# Patient Record
Sex: Female | Born: 1955 | Race: Black or African American | Hispanic: No | Smoking: Never smoker
Health system: Southern US, Community
[De-identification: ages and names within clinical notes are randomized; demographics above are authoritative.]

## PROBLEM LIST (undated history)

## (undated) HISTORY — PX: PARTIAL HYSTERECTOMY: SHX80

---

## 1999-10-26 ENCOUNTER — Other Ambulatory Visit: Admission: RE | Admit: 1999-10-26 | Discharge: 1999-10-26 | Payer: Self-pay | Admitting: Obstetrics

## 2000-01-15 ENCOUNTER — Ambulatory Visit (HOSPITAL_COMMUNITY): Admission: RE | Admit: 2000-01-15 | Discharge: 2000-01-15 | Payer: Self-pay | Admitting: Obstetrics

## 2000-01-15 ENCOUNTER — Encounter: Payer: Self-pay | Admitting: Obstetrics

## 2000-08-26 ENCOUNTER — Emergency Department (HOSPITAL_COMMUNITY): Admission: EM | Admit: 2000-08-26 | Discharge: 2000-08-26 | Payer: Self-pay | Admitting: Emergency Medicine

## 2000-08-26 ENCOUNTER — Encounter: Payer: Self-pay | Admitting: *Deleted

## 2000-09-29 ENCOUNTER — Emergency Department (HOSPITAL_COMMUNITY): Admission: EM | Admit: 2000-09-29 | Discharge: 2000-09-29 | Payer: Self-pay | Admitting: Emergency Medicine

## 2001-02-04 ENCOUNTER — Other Ambulatory Visit: Admission: RE | Admit: 2001-02-04 | Discharge: 2001-02-04 | Payer: Self-pay | Admitting: Obstetrics

## 2001-02-18 ENCOUNTER — Ambulatory Visit (HOSPITAL_COMMUNITY): Admission: RE | Admit: 2001-02-18 | Discharge: 2001-02-18 | Payer: Self-pay

## 2001-02-18 ENCOUNTER — Encounter: Payer: Self-pay | Admitting: Obstetrics

## 2002-09-21 ENCOUNTER — Inpatient Hospital Stay (HOSPITAL_COMMUNITY): Admission: AD | Admit: 2002-09-21 | Discharge: 2002-09-21 | Payer: Self-pay | Admitting: *Deleted

## 2002-10-01 ENCOUNTER — Encounter: Admission: RE | Admit: 2002-10-01 | Discharge: 2002-10-01 | Payer: Self-pay | Admitting: Obstetrics and Gynecology

## 2002-10-08 ENCOUNTER — Ambulatory Visit (HOSPITAL_COMMUNITY): Admission: RE | Admit: 2002-10-08 | Discharge: 2002-10-08 | Payer: Self-pay

## 2002-11-05 ENCOUNTER — Encounter: Admission: RE | Admit: 2002-11-05 | Discharge: 2002-11-05 | Payer: Self-pay | Admitting: Obstetrics and Gynecology

## 2002-11-26 ENCOUNTER — Encounter: Admission: RE | Admit: 2002-11-26 | Discharge: 2002-11-26 | Payer: Self-pay | Admitting: Obstetrics and Gynecology

## 2002-11-30 ENCOUNTER — Inpatient Hospital Stay (HOSPITAL_COMMUNITY): Admission: RE | Admit: 2002-11-30 | Discharge: 2002-12-03 | Payer: Self-pay | Admitting: Family Medicine

## 2002-11-30 ENCOUNTER — Encounter (INDEPENDENT_AMBULATORY_CARE_PROVIDER_SITE_OTHER): Payer: Self-pay | Admitting: Specialist

## 2002-12-10 ENCOUNTER — Encounter: Admission: RE | Admit: 2002-12-10 | Discharge: 2002-12-10 | Payer: Self-pay | Admitting: Obstetrics and Gynecology

## 2004-02-28 ENCOUNTER — Ambulatory Visit (HOSPITAL_COMMUNITY): Admission: RE | Admit: 2004-02-28 | Discharge: 2004-02-28 | Payer: Self-pay | Admitting: Family Medicine

## 2004-03-13 ENCOUNTER — Encounter: Admission: RE | Admit: 2004-03-13 | Discharge: 2004-03-13 | Payer: Self-pay | Admitting: Family Medicine

## 2005-06-14 ENCOUNTER — Encounter: Admission: RE | Admit: 2005-06-14 | Discharge: 2005-06-14 | Payer: Self-pay | Admitting: Internal Medicine

## 2005-07-03 ENCOUNTER — Encounter: Admission: RE | Admit: 2005-07-03 | Discharge: 2005-07-03 | Payer: Self-pay | Admitting: Internal Medicine

## 2007-01-10 ENCOUNTER — Emergency Department (HOSPITAL_COMMUNITY): Admission: EM | Admit: 2007-01-10 | Discharge: 2007-01-10 | Payer: Self-pay | Admitting: Emergency Medicine

## 2007-12-15 ENCOUNTER — Encounter: Admission: RE | Admit: 2007-12-15 | Discharge: 2007-12-15 | Payer: Self-pay | Admitting: Family Medicine

## 2007-12-19 ENCOUNTER — Emergency Department (HOSPITAL_COMMUNITY): Admission: EM | Admit: 2007-12-19 | Discharge: 2007-12-19 | Payer: Self-pay | Admitting: Emergency Medicine

## 2009-02-09 IMAGING — MG MM DIGITAL SCREENING BILAT W/ CAD
4 series · 4 of 4 positions shown · non-contrast
Comparison: Prior studies.

DG SCREEN MAMMOGRAM BILATERAL
Bilateral CC and MLO view(s) were taken.
Prior study comparison: June 14, 2005, bilateral diagnostic mammogram.

DIGITAL SCREENING MAMMOGRAM WITH CAD:

[R CC]
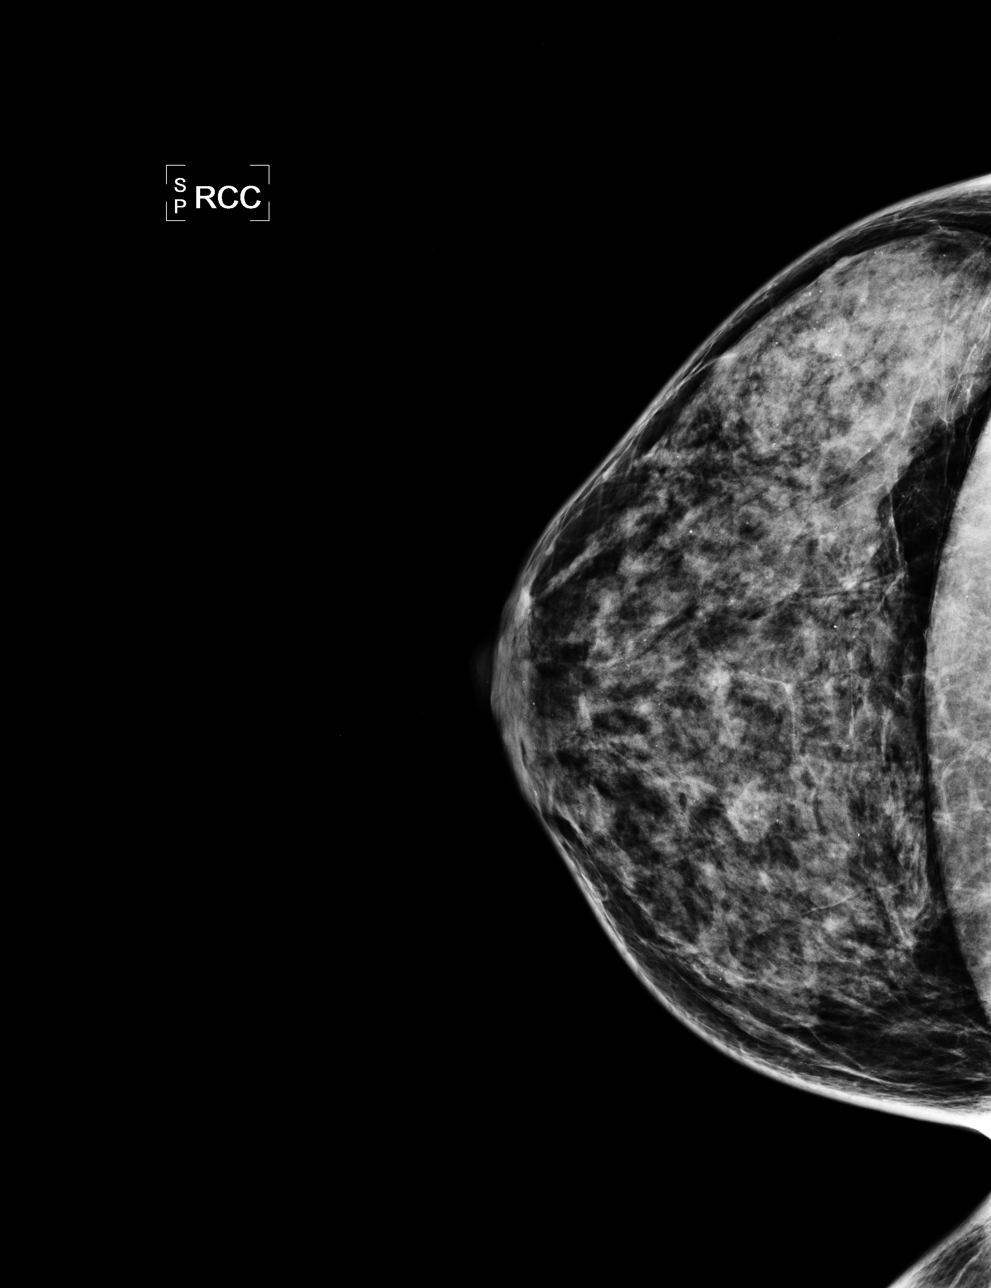

[L CC]
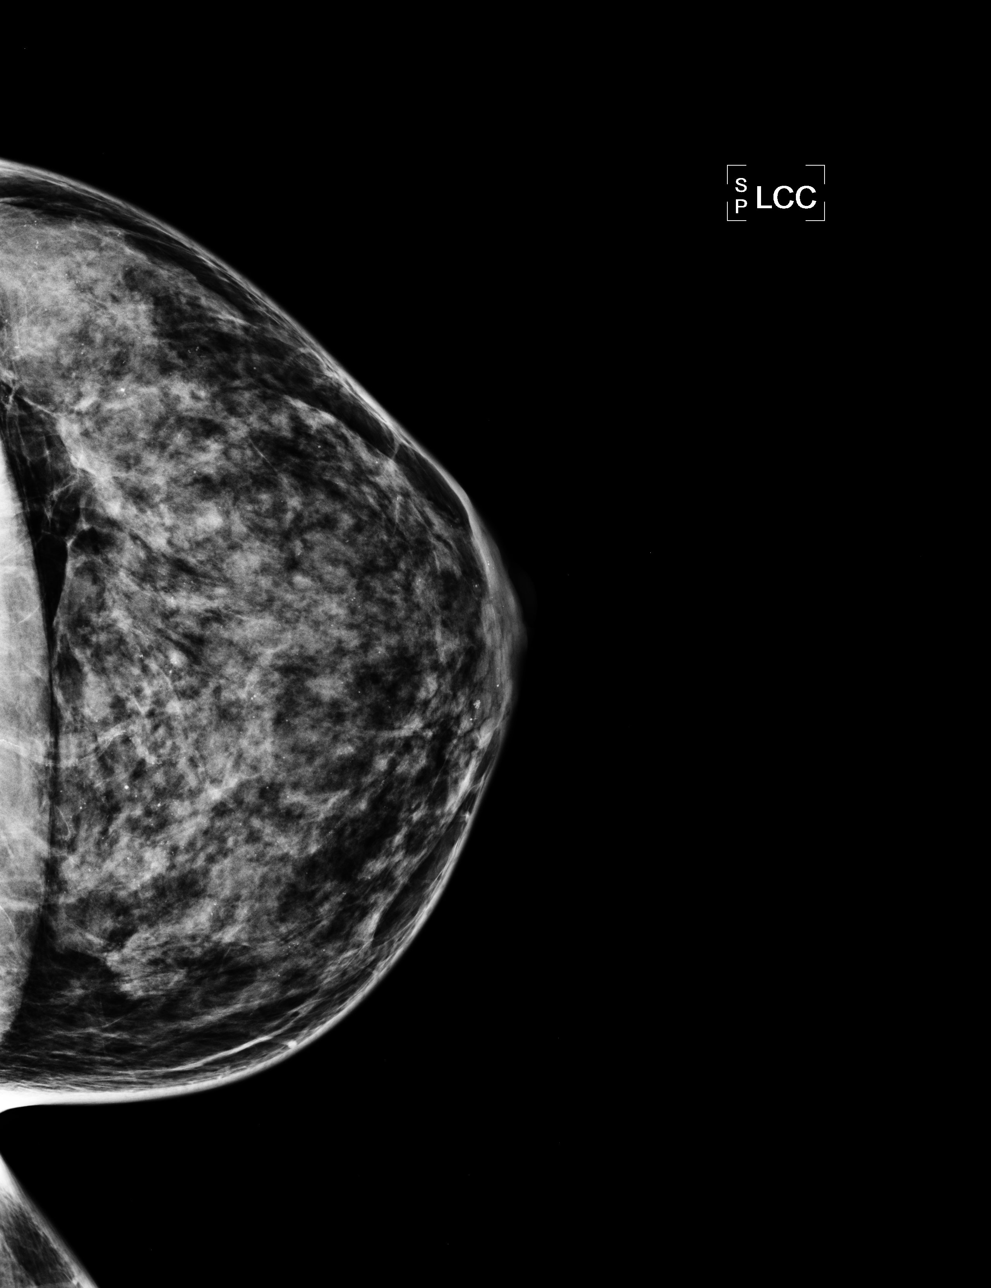

[L MLO]
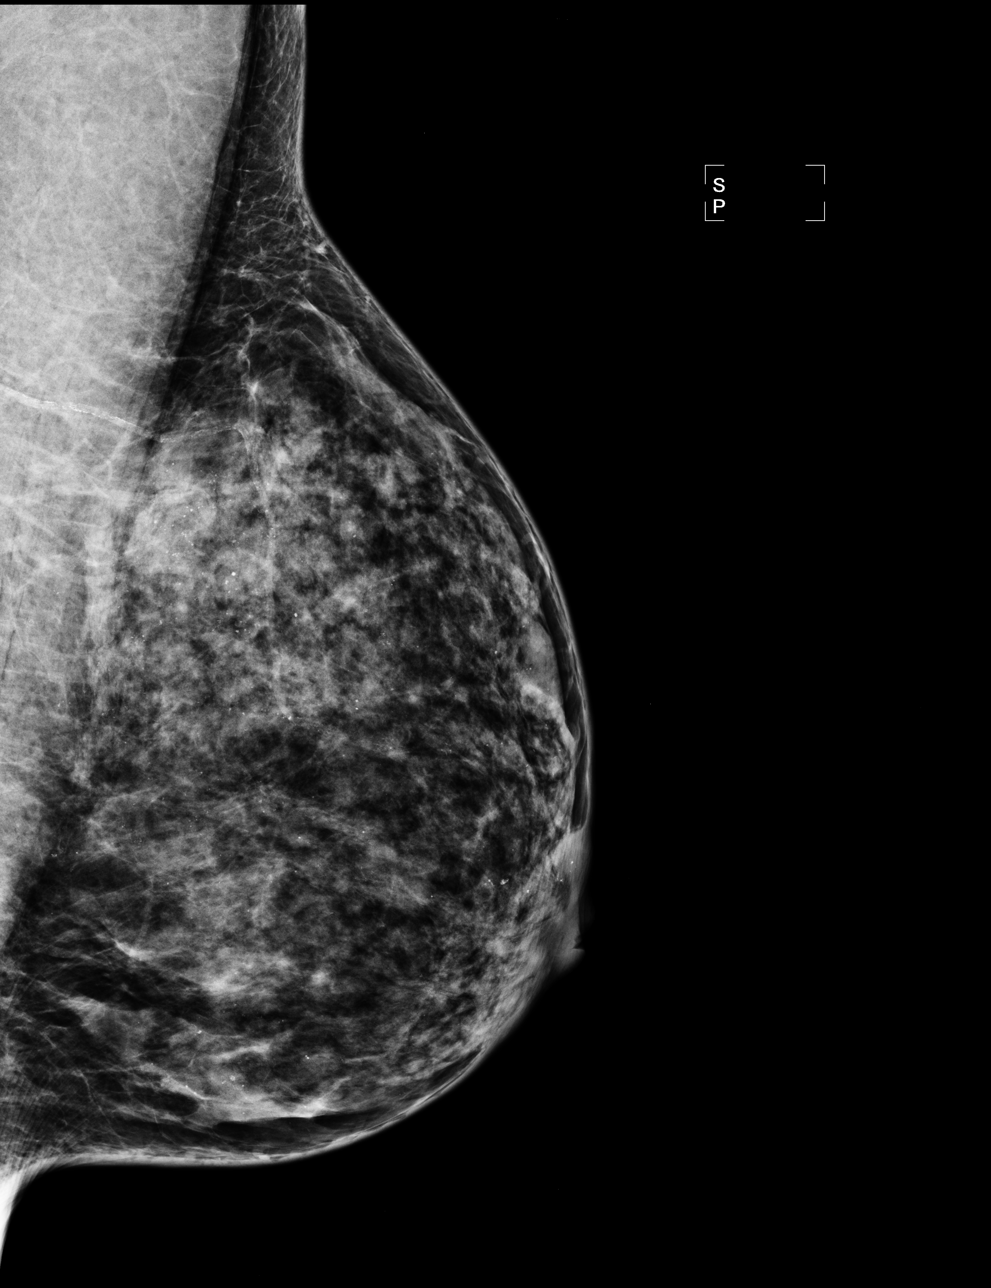

[R MLO]
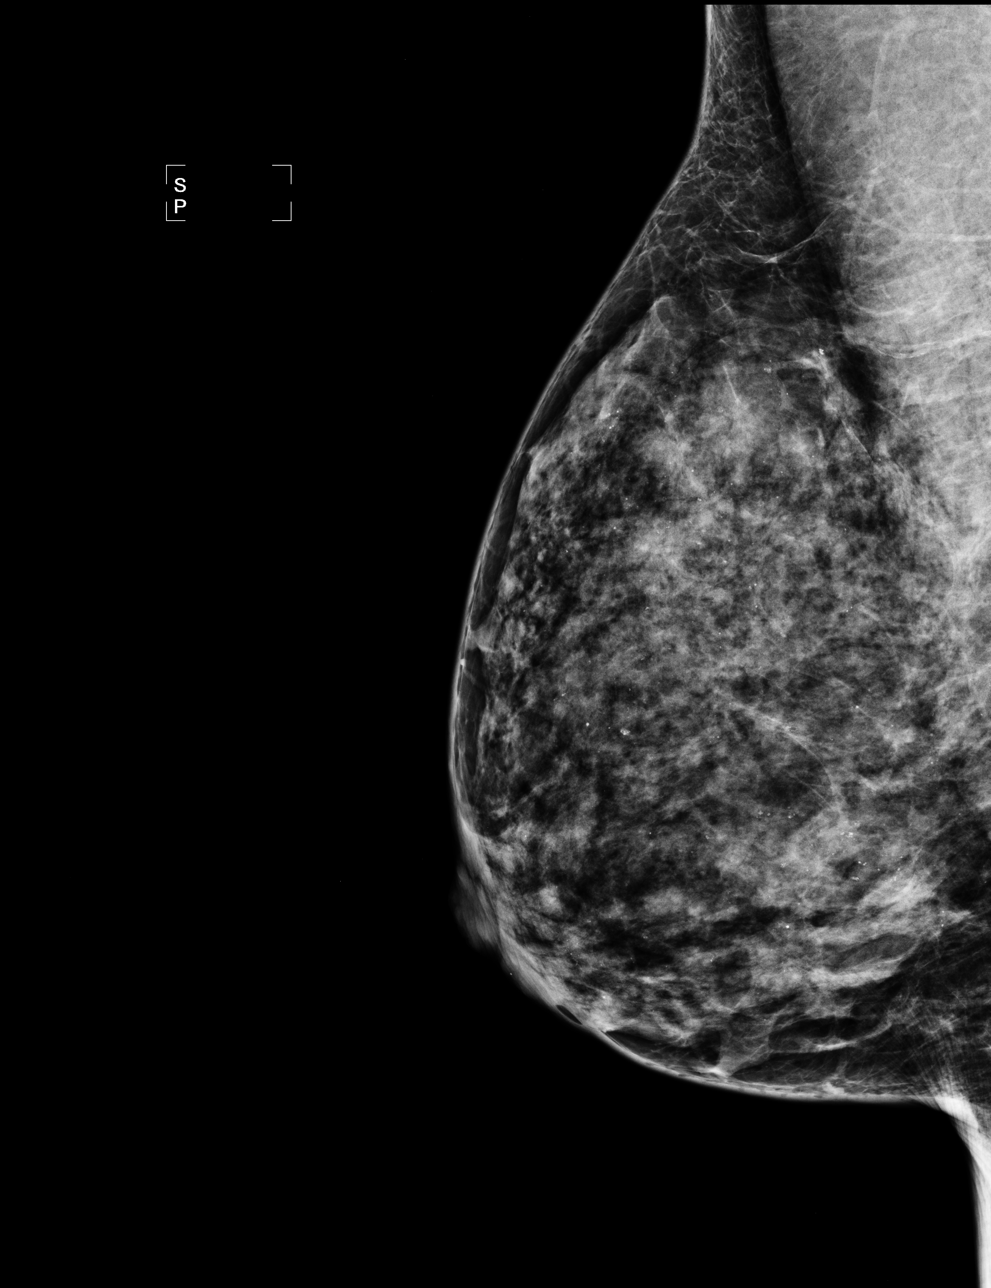

[4 of 4 positions shown; findings below may reference images not displayed]

The breast tissue is extremely dense.  There is no dominant mass, architectural distortion or 
calcification to suggest malignancy.
IMPRESSION: No mammographic evidence of malignancy.  Suggest yearly screening mammography.

ASSESSMENT: Negative - BI-RADS 1

Screening mammogram in 1 year.
ANALYZED BY COMPUTER AIDED DETECTION. , THIS PROCEDURE WAS A DIGITAL MAMMOGRAM.

## 2009-03-09 ENCOUNTER — Ambulatory Visit: Payer: Self-pay | Admitting: Sports Medicine

## 2009-03-09 DIAGNOSIS — M7750 Other enthesopathy of unspecified foot: Secondary | ICD-10-CM | POA: Insufficient documentation

## 2009-03-09 DIAGNOSIS — M79609 Pain in unspecified limb: Secondary | ICD-10-CM

## 2009-03-29 ENCOUNTER — Ambulatory Visit: Payer: Self-pay | Admitting: Sports Medicine

## 2009-03-29 DIAGNOSIS — M84376A Stress fracture, unspecified foot, initial encounter for fracture: Secondary | ICD-10-CM | POA: Insufficient documentation

## 2009-04-06 ENCOUNTER — Encounter: Admission: RE | Admit: 2009-04-06 | Discharge: 2009-04-06 | Payer: Self-pay | Admitting: Family Medicine

## 2009-04-11 ENCOUNTER — Emergency Department (HOSPITAL_COMMUNITY): Admission: EM | Admit: 2009-04-11 | Discharge: 2009-04-11 | Payer: Self-pay | Admitting: *Deleted

## 2009-04-19 ENCOUNTER — Ambulatory Visit: Payer: Self-pay | Admitting: Sports Medicine

## 2009-05-17 ENCOUNTER — Ambulatory Visit: Payer: Self-pay | Admitting: Sports Medicine

## 2009-07-17 ENCOUNTER — Emergency Department (HOSPITAL_COMMUNITY): Admission: EM | Admit: 2009-07-17 | Discharge: 2009-07-17 | Payer: Self-pay | Admitting: Emergency Medicine

## 2009-09-04 ENCOUNTER — Emergency Department (HOSPITAL_COMMUNITY): Admission: EM | Admit: 2009-09-04 | Discharge: 2009-09-04 | Payer: Self-pay | Admitting: Nurse Practitioner

## 2009-09-05 ENCOUNTER — Ambulatory Visit: Payer: Self-pay | Admitting: Family Medicine

## 2009-09-05 DIAGNOSIS — S92919A Unspecified fracture of unspecified toe(s), initial encounter for closed fracture: Secondary | ICD-10-CM | POA: Insufficient documentation

## 2010-06-17 ENCOUNTER — Emergency Department (HOSPITAL_COMMUNITY): Admission: EM | Admit: 2010-06-17 | Discharge: 2010-06-18 | Payer: Self-pay | Admitting: Emergency Medicine

## 2011-04-01 LAB — URINALYSIS, ROUTINE W REFLEX MICROSCOPIC
Bilirubin Urine: NEGATIVE
Nitrite: NEGATIVE
Protein, ur: NEGATIVE mg/dL
Specific Gravity, Urine: 1.004 — ABNORMAL LOW (ref 1.005–1.030)
Urobilinogen, UA: 0.2 mg/dL (ref 0.0–1.0)

## 2011-04-01 LAB — DIFFERENTIAL
Basophils Absolute: 0 10*3/uL (ref 0.0–0.1)
Basophils Relative: 1 % (ref 0–1)
Eosinophils Absolute: 0.1 10*3/uL (ref 0.0–0.7)
Eosinophils Relative: 3 % (ref 0–5)
Monocytes Absolute: 0.3 10*3/uL (ref 0.1–1.0)
Monocytes Relative: 6 % (ref 3–12)
Neutro Abs: 2.1 10*3/uL (ref 1.7–7.7)

## 2011-04-01 LAB — CBC
HCT: 38.8 % (ref 36.0–46.0)
Hemoglobin: 13.2 g/dL (ref 12.0–15.0)
MCHC: 34.1 g/dL (ref 30.0–36.0)
MCV: 93.3 fL (ref 78.0–100.0)
RBC: 4.16 MIL/uL (ref 3.87–5.11)
RDW: 13.2 % (ref 11.5–15.5)

## 2011-04-01 LAB — BASIC METABOLIC PANEL
BUN: 5 mg/dL — ABNORMAL LOW (ref 6–23)
Calcium: 9.7 mg/dL (ref 8.4–10.5)
Creatinine, Ser: 0.62 mg/dL (ref 0.4–1.2)
GFR calc non Af Amer: >60 mL/min (ref >60)
Glucose, Bld: 93 mg/dL (ref 70–99)

## 2011-04-01 LAB — URINE MICROSCOPIC-ADD ON

## 2011-05-11 NOTE — Op Note (Signed)
NAME:  Alejandra Scott, Alejandra Scott                        ACCOUNT NO.:  [id number]   MEDICAL RECORD NO.:  [id number]                   PATIENT TYPE:  INP   LOCATION:  9322                                 FACILITY:  [facility]   PHYSICIAN:  Tanya S. Shawnie Pons, M.D.                DATE OF BIRTH:  May 06, 1956   DATE OF PROCEDURE:  11/30/2002  DATE OF DISCHARGE:                                 OPERATIVE REPORT   PREOPERATIVE DIAGNOSES:  1. Fibroid uterus.  2. Pelvic pain.  3. Menometrorrhagia.  4. History of anemia.  5. History of recurrent yeast infections.   POSTOPERATIVE DIAGNOSES:  1. Fibroid uterus.  2. Pelvic pain.  3. Menometrorrhagia.  4. History of anemia.  5. History of recurrent yeast infections.   PROCEDURE:  Total abdominal hysterectomy.   SURGEON:  Shelbie Proctor. Shawnie Pons, M.D.   ASSISTANT:  Mary Sella. Orlene Erm, M.D.   ANESTHESIA:  General.   FINDINGS:  Large fibroid uterus, normal appearing ovaries and tubes.   ESTIMATED BLOOD LOSS:  Approximately 250 cc.   SPECIMENS:  Uterus and cervix to pathology.   INDICATIONS:  The patient is a 55 year old gravida 4, para 3 with an  approximate two year history of known large fibroids that cause her  excessive pain and heavy menses.  She does have a history of anemia.  She  decided for definitive therapy and is admitted for hysterectomy.   DESCRIPTION OF PROCEDURE:  The patient is taken to the OR where general  anesthesia is administered.  She was placed in the supine position where  anesthesia was found to be adequate.  She was prepped and draped in the  usual sterile fashion.  A knife was used to make a Pfannenstiel incision in  the skin and this incision was carried down to the underlying fascia using  the Bovie cautery.  The fascia was then entered sharply and dissected  laterally also with the Bovie cautery.  Two Kocher's were used to dissect  the fascia off the underlying rectus bluntly laterally and with the Bovie  cautery in the midline.  This  was repeated in the inferior edge of the  fascia as well.  The rectus was subsequently divided in its midline and  taken down in the midline with the Bovie cautery.  Peritoneum was then  grasped and entered sharply with the Mayo scissors.  The peritoneum incision  was extended laterally with the Bovie cautery as well as inferiorly and  superiorly.  The Balfour retractor was then placed inside the abdominal  cavity.  The Balfour had to be replaced several times as the incisions were  extended further to allow for enough room.  Initially, her uterus could not  be lifted up and removed.  Once the [facility] was then placed the bowel  adequately packed, the uterus picked up.  There was noted to be an evulsed  fibroid and a figure-of-eight was used  to close this incision that was  bleeding.  The round was then suture ligated and cut with a Bovie cautery on  the patient's left.  Similarly, the right was done and the anterior leaf of  the broad ligament was then taken down.  This was carried across to initiate  a bladder flap.  The posterior leaf of the broad ligament was also taken  down and dissection done between the two leaves of the broad to locate the  ureters on both sides.  After this the utero-ovarian pedicle was doubly  clamped and ligated with first a free tie and then a suture.  The uterine  arteries were cleaned off and curved Heaney clamps were used to ligate the  uterine arteries.  Once these were ligated the straight Kocher clamps were  used sequentially to take bites down the cervix which were then similarly  ligated with a suture until the uterosacrals were taken.  They were also  suture ligated with Heaney stitch and held.  Two curved clamps were then  placed across the cervix over the vagina and cut.  The vagina was grasped  with Kocher clamps and then after an angle stitch was placed the vaginal  cuff was closed with figure-of-eight suture.  The abdomen was copiously  irrigated  with warm irrigation and it was found to be hemostatic.  The left  tube and ovary were down inside the cul-de-sac and these were moved up along  the abdominal side wall.  All packs were removed from the abdominal cavity  and the Balfour retractor also removed.  The fascia was then closed with a 0  Vicryl suture in a running fashion, the subcutaneous tissue irrigated, and  the skin closed with staples.  All instrument, needle, lap counts were  correct x2.  At the end of the case a small abrasion was noted on the  patient's abdomen believed to be consistent with possibly skin being trapped  inside the Kreamer.  Neosporin was placed over this and after the abdomen  had been cleaned of Betadine and a sterile bandage placed over this part, a  sterile bandage was also placed over the incision.  The patient was awakened  and taken to recovery room in stable condition.                                               Shelbie Proctor. Shawnie Pons, M.D.    TSP/MEDQ  D:  11/30/2002  T:  11/30/2002  Job:  [id number]

## 2011-05-11 NOTE — Discharge Summary (Signed)
NAME:  Alejandra Scott, Alejandra Scott                        ACCOUNT NO.:  [id number]   MEDICAL RECORD NO.:  [id number]                   PATIENT TYPE:  INP   LOCATION:  9322                                 [facility]ACILITY:  [facility]   PHYSICIAN:  Tanya S. Shawnie Pons, M.D.                DATE OF BIRTH:  22-Sep-1956   DATE OF ADMISSION:  11/30/2002  DATE OF DISCHARGE:  12/03/2002                                 DISCHARGE SUMMARY   DISCHARGE DIAGNOSES:  Fibroid uterus.   ADDITIONAL DIAGNOSES:  1. Significant pelvic pain.  2. Menometrorrhagia.  3. History of anemia.  4. Recurrent yeast vaginitis.   PROCEDURE:  Total abdominal hysterectomy.   LABORATORY VALUES:  Preoperative hemoglobin 14.4, postoperative hemoglobin  11.4.  Blood type B-.  Electrolytes are within normal limits on  postoperative day number one.  She had a negative urine pregnancy test.   REASON FOR ADMISSION:  Briefly, the patient is a 55 year old gravida 4, para  3 with a 10-year history of known fibroid uterus that has caused increasing  pelvic pain and menometrorrhagia who desires definitive treatment.   HOSPITAL COURSE:  The patient was admitted on the day of surgery and  underwent a total abdominal hysterectomy.  Postoperatively she was  transferred to the floor and was placed on a PCA morphine pump.  She had  adequate analgesia and on postoperative day number one was able to ambulate.  Her Foley was discontinued.  She was able to void without difficulty.  She  began passing flatus on this day and was advanced to a regular diet on  postoperative day number two.  There was some difficulty in pain control  after her PCA was discontinued.  This was worked out prior to discharge.  The patient remained afebrile, was tolerating a regular diet, and pain was  adequately controlled at the time of discharge.  Prior to discharge her  staples were removed.  On postoperative day number three patient was doing  so well it was felt she was stable for  discharge.   DISCHARGE DISPOSITION:  Condition:  The patient discharged home in good  condition.  Follow-up will be in two weeks at the [facility].   DISCHARGE MEDICATIONS:  1. Vicodin one to two p.o. q.4-6h. p.r.n. severe pain.  2. Naprosyn 500 mg one p.o. b.i.d. for pain.   DISCHARGE INSTRUCTIONS:  No heavy lifting for the next six weeks.  No  intercourse for the next two weeks.  No driving for the next one week.  Follow-up will be in [facility] Clinic in two weeks.                                               Shelbie Proctor. Shawnie Pons, M.D.    TSP/MEDQ  D:  12/03/2002  T:  12/03/2002  Job:  [id number]

## 2011-05-11 NOTE — H&P (Signed)
NAME:  Alejandra Scott, Alejandra Scott                        ACCOUNT NO.:  [id number]   MEDICAL RECORD NO.:  [id number]                   PATIENT TYPE:  INP   LOCATION:  NA                                   [facility]   PHYSICIAN:  Tanya S. Shawnie Pons, M.D.                DATE OF BIRTH:  1956-09-01   DATE OF ADMISSION:  11/30/2002  DATE OF DISCHARGE:                                HISTORY & PHYSICAL   REASON FOR ADMISSION:  Total abdominal hysterectomy.   HISTORY OF PRESENT ILLNESS:  The patient is a 55 year old gravida 4, para 3  with approximately a two year history of known fibroids that has caused  pelvic pain.  The patient reports increasing abdominal girth and pain over  the last two years with heavy vaginal bleeding.  Her menses have also become  increasing in duration.  The patient also reports some incontinence with  coughing and laughing.   PAST MEDICAL HISTORY:  Significant for anemia.   PAST SURGICAL HISTORY:  She has had a prior BTL.   ALLERGIES:  She has no known allergies.   MEDICATIONS:  Advil p.r.n., numerous vitamins and herbal preparations for  various things.   SOCIAL HISTORY:  She drinks one to two glasses of alcohol a week.  She uses  no tobacco, no drugs.  She works as a Associate Professor.   FAMILY HISTORY:  Significant for pancreatic cancer in mother.  Numerous  ovarian and stomach cancers in family members.  She has no significant  history of breast cancer.  Has sister with diabetes and a brother with  coronary artery disease.   PAST OB HISTORY:  Significant for spontaneous vaginal delivery x3 with the  largest child being 6 pounds 2 ounces, SAB x1.   PAST GYN HISTORY:  She had menarche at age 89 and menses are every month and  last approximately nine days with heavy flow.  She has a history of one STD,  Trichomonas, which was diagnosed in June 2002 and treated.  She has no  history of abnormal Paps.  Her last Pap was in October and was normal.  Her  last mammogram  was in February 2002 and was also normal.   REVIEW OF SYSTEMS:  The patient has no fevers, chills.  No problems with  blurry vision, headaches, or shortness of breath or chest pain.  She does  not have any epigastric pain or tenderness.  She does report abdominal pain,  pelvic pain as reported in the HPI.  GU and GYN history and review of  systems are as recorded in the history of present illness.  She denies any  lower extremity swelling.   PHYSICAL EXAMINATION:  VITAL SIGNS:  The patient has a resting pulse of 52.  Her weight is 154 pounds.  GENERAL:  She is a well-developed, well-nourished African-American female in  no acute distress.  HEENT:  Normocephalic, atraumatic.  Sclerae are nonicteric.  Extraocular  movements are intact.  Pupils equal, round, reactive to light and  accommodation bilaterally.  NECK:  Supple with normal thyroid.  She has no lymphadenopathy.  BREASTS:  Symmetric with a left inverted nipple.  The right nipple appears  normal.  There are no masses.  No axillary, supraclavicular lymphadenopathy.  HEART:  Regular rate and rhythm with no rubs, gallops, or murmurs.  LUNGS:  Clear to auscultation bilaterally.  ABDOMEN:  Soft and nontender.  There is a firm mass noted infraumbilically.  EXTREMITIES:  Without clubbing, cyanosis, edema.  BACK:  Without CVA tenderness.  GENITOURINARY:  She has normal external female genitalia.  Her vagina is  well estrogenized, rugae.  She has copious amount of thick adherent yellow  discharge consistent with yeast infection.  Her cervix appears normal.  Uterus is large, firm, irregular, and approximately 16-18 weeks size.  It is  mobile, but very tender on examination.  Her adnexa are not well  appreciated.   IMPRESSION:  1. Fibroid uterus, very large.  2. Pelvic pain.  3. Menometrorrhagia.  4. History of anemia.  5. History of recurrent yeast vaginitis.  6. Incontinence.   PLAN:  The patient will be admitted for a total  abdominal hysterectomy.  Discussed leaving her ovaries in situ as well as possible corrective surgery  for uterine incontinence.  Will review this at the time of her admission,  the notes from the urologist who did do formal urodynamic testing on this  patient and then decide if we should proceed with some sort of corrective  surgery for this.                                               Alejandra Scott. Shawnie Pons, M.D.    TSP/MEDQ  D:  11/28/2002  T:  11/28/2002  Job:  [id number]

## 2015-02-28 ENCOUNTER — Emergency Department (HOSPITAL_COMMUNITY)
Admission: EM | Admit: 2015-02-28 | Discharge: 2015-03-01 | Disposition: A | Discharge status: Home / Self Care | Payer: Self-pay | Attending: Emergency Medicine | Admitting: Emergency Medicine

## 2015-02-28 ENCOUNTER — Encounter (HOSPITAL_COMMUNITY): Payer: Self-pay | Admitting: Emergency Medicine

## 2015-02-28 DIAGNOSIS — E860 Dehydration: Secondary | ICD-10-CM | POA: Insufficient documentation

## 2015-02-28 DIAGNOSIS — R5383 Other fatigue: Secondary | ICD-10-CM | POA: Insufficient documentation

## 2015-02-28 DIAGNOSIS — Z79899 Other long term (current) drug therapy: Secondary | ICD-10-CM | POA: Insufficient documentation

## 2015-02-28 DIAGNOSIS — Z90710 Acquired absence of both cervix and uterus: Secondary | ICD-10-CM | POA: Insufficient documentation

## 2015-02-28 DIAGNOSIS — Z880 Allergy status to penicillin: Secondary | ICD-10-CM | POA: Insufficient documentation

## 2015-02-28 DIAGNOSIS — R531 Weakness: Secondary | ICD-10-CM | POA: Insufficient documentation

## 2015-02-28 DIAGNOSIS — R42 Dizziness and giddiness: Secondary | ICD-10-CM | POA: Insufficient documentation

## 2015-02-28 MED ORDER — ONDANSETRON HCL 4 MG/2ML IJ SOLN
4.0000 mg | Freq: Once | INTRAMUSCULAR | Status: AC
  Administered 2015-02-28: 4 mg via INTRAVENOUS
  Filled 2015-02-28: qty 2

## 2015-02-28 MED ORDER — SODIUM CHLORIDE 0.9 % IV SOLN
1000.0000 mL | Freq: Once | INTRAVENOUS | Status: AC
  Administered 2015-02-28: 1000 mL via INTRAVENOUS

## 2015-02-28 NOTE — ED Notes (Signed)
Per EMS pt took some supplemental fat burning pills and 6 glucosamine tablets this morning and since has been having nausea and vomiting

## 2015-03-01 LAB — I-STAT CHEM 8, ED
BUN: 14 mg/dL (ref 6–23)
Calcium, Ion: 1.14 mmol/L (ref 1.12–1.23)
Chloride: 102 mmol/L (ref 96–112)
Creatinine, Ser: 0.7 mg/dL (ref 0.50–1.10)
GLUCOSE: 109 mg/dL — AB (ref 70–99)
HEMATOCRIT: 45 % (ref 36.0–46.0)
HEMOGLOBIN: 15.3 g/dL — AB (ref 12.0–15.0)
POTASSIUM: 3.8 mmol/L (ref 3.5–5.1)
Sodium: 140 mmol/L (ref 135–145)
TCO2: 21 mmol/L (ref 0–100)

## 2015-03-01 NOTE — ED Provider Notes (Signed)
CSN: [id number]     Arrival date & time 02/28/15  2104 History   First MD Initiated Contact with Patient 02/28/15 2306     Chief Complaint  Patient presents with  . Emesis     (Consider location/radiation/quality/duration/timing/severity/associated sxs/prior Treatment) HPI 59 year old female presents to the emergency department with complaint of weakness, fatigue, nausea and vomiting.  Patient reports that she took thermogenic fat burning pills this morning along with glucosamine worked out for 2-1/2 hours and then sat in a sauna for an hour and a half.  Patient reports after this time she felt weak and lightheaded and nauseated.  She reports several episodes of vomiting.  Patient had not taken thermogenic in the past.  She had been taken glucosamine, but realized that it is seafood based and she is a vegan so will no longer be taking that either.  Patient reports she is feeling much better after IV fluids and Zofran. History reviewed. No pertinent past medical history. Past Surgical History  Procedure Laterality Date  . Partial hysterectomy     Family History  Problem Relation Age of Onset  . Cancer Other   . Hypertension Other    History  Substance Use Topics  . Smoking status: Never Smoker   . Smokeless tobacco: Not on file  . Alcohol Use: Yes     Comment: occ   OB History    No data available     Review of Systems  See History of Present Illness; otherwise all other systems are reviewed and negative   Allergies  Penicillins  Home Medications   Prior to Admission medications   Medication Sig Start Date End Date Taking? Authorizing Provider  Calcium-Magnesium 500-250 MG TABS Take 1 tablet by mouth daily.   Yes Historical Provider, MD  Glucos-MSM-C-Mn-Ginger-Willow (GLUCOSAMINE MSM COMPLEX PO) Take 1 tablet by mouth daily.   Yes Historical Provider, MD   BP 133/72 mmHg  Pulse 54  Temp(Src) 97.6 F (36.4 C) (Oral)  Resp 13  Ht 5\' 4"  (1.626 m)  Wt 135 lb (61.236 kg)   BMI 23.16 kg/m2  SpO2 100% Physical Exam  Constitutional: She is oriented to person, place, and time. She appears well-developed and well-nourished.  HENT:  Head: Normocephalic and atraumatic.  Nose: Nose normal.  Mouth/Throat: Oropharynx is clear and moist.  Eyes: Conjunctivae and EOM are normal. Pupils are equal, round, and reactive to light.  Neck: Normal range of motion. Neck supple. No JVD present. No tracheal deviation present. No thyromegaly present.  Cardiovascular: Normal rate, regular rhythm, normal heart sounds and intact distal pulses.  Exam reveals no gallop and no friction rub.   No murmur heard. Pulmonary/Chest: Effort normal and breath sounds normal. No stridor. No respiratory distress. She has no wheezes. She has no rales. She exhibits no tenderness.  Abdominal: Soft. Bowel sounds are normal. She exhibits no distension and no mass. There is no tenderness. There is no rebound and no guarding.  Musculoskeletal: Normal range of motion. She exhibits no edema or tenderness.  Lymphadenopathy:    She has no cervical adenopathy.  Neurological: She is alert and oriented to person, place, and time. She displays normal reflexes. She exhibits normal muscle tone. Coordination normal.  Skin: Skin is warm and dry. No rash noted. No erythema. No pallor.  Psychiatric: She has a normal mood and affect. Her behavior is normal. Judgment and thought content normal.  Nursing note and vitals reviewed.   ED Course  Procedures (including critical care time) Labs  Review Labs Reviewed  I-STAT CHEM 8, ED - Abnormal; Notable for the following:    Glucose, Bld 109 (*)    Hemoglobin 15.3 (*)    All other components within normal limits    Imaging Review No results found.   EKG Interpretation   Date/Time:  Monday February 28 2015 23:10:05 EST Ventricular Rate:  62 PR Interval:  154 QRS Duration: 130 QT Interval:  453 QTC Calculation: 460 R Axis:   -103 Text Interpretation:  Sinus rhythm  RBBB and LAFB No old tracing to compare  Confirmed by Temitope Flammer  MD, Margurite Duffy ([id number]) on 03/01/2015 12:17:41 AM      MDM   Final diagnoses:  Dehydration    59 yo female with dehydration after fat burning supplement, sauna, working out.  Pt feeling and looking well now, plan for d/c if able to tolerate PO    Marisa Severin, MD 03/01/15 [phone]

## 2015-03-01 NOTE — Discharge Instructions (Signed)
Dehydration in Sports The body uses the kidney, bladder, and thirst mechanisms in an intricate system to maintain the proper fluid levels in the body. When this system is stressed, such as during exercise, the system may not be able to maintain these levels. This results in the body lacking water (dehydration). Dehydration can be a problem because the body requires a certain amount of water and other fluids to maintain its blood volume. Fluid is lost when you urinate, sweat, breathe, vomit, or have diarrhea. Dehydration occurs when you drink less fluid than you lose. Dehydration may occur even before you become thirsty. It is important for athletes to keep drinking during activity, even if they do not feel thirsty. SYMPTOMS   Thirst.  Dry mouth.  Tiredness (lethargy).  Dark urine.  Headache.  Muscle cramps.  Rapid breathing.  Lightheadedness, especially when you stand from a sitting position.  Dry, warm skin.  Little or no urination.  Low blood pressure.  Fainting (syncope).  Delirium or unconsciousness. RISK INCREASES WITH:  Diarrhea.  Vomiting.  Inadequate fluid intake during an illness or strenuous exercise.  Inadequate food intake during an illness, during strenuous exercise, or after strenuous exercise.  Use of diuretic medicines, which control excess body fluid by causing fluid loss.  Certain age groups. Infants and the elderly are at greater risk. PREVENTION  Drink frequently throughout physical activity even if you do not feel thirsty. Drink small amounts of fluid frequently throughout and after sporting events.  Drink extra fluids to keep up with any ongoing losses (sweating, diarrhea).  Carry extra water and the ingredients for making an oral rehydration solution (ORS).  If you have diarrhea or vomiting or you are not drinking much, force yourself to drink more liquids before you become dehydrated. RELATED COMPLICATIONS   Reduced ability to dissipate  heat, resulting in elevated core body temperatures.  Heat illness.  Heat stroke.  Kidney failure. TREATMENT Mild dehydration is treated by drinking enough fluid to replace the fluids you have lost. You may also need to replace the electrolytes you have lost. Recommendations for replenishing fluids and electrolytes include drinking sips of water slowly, eating foods with salt, drinking sports drinks, or taking over-the-counter dehydration medicines. Treat dehydration immediately. Do not wait until dehydration becomes severe.  Packets of ORS are widely available. Follow the directions on the packet. If no instructions are given, mix the contents of the ORS with 1 quart or liter of drinking water. If you are not sure if the water is safe to drink, first boil the water for at least 5 minutes.  If you are unable to obtain ORS you may create your own by adding 2 tbs of sugar or honey,  tsp salt, and  tsp baking soda to 1 quart or liter of water. If you do not have any baking soda, add another  tsp of salt. If possible, add  cup orange juice or some mashed banana to improve the taste and provide some potassium. Drink sips of the ORS every 5 minutes until urination becomes normal. It is normal to urinate 4 or 5 times a day. Adults and adolescents should drink at least 3 quarts or liters of ORS a day until they are well. For individuals who are vomiting or have diarrhea, it is important to keep trying to drink the ORS. Your body may retain some of the fluids and salts you need even if you are vomiting or have diarrhea. Remember to take only sips of liquids. Chilling   the ORS may help. Severe dehydration is a medical emergency. If you have symptoms of severe dehydration, seek medical care immediately. It may be necessary for you to receive intravenous (IV) fluids. If you are able to drink, you should also drink the ORS. With treatment for dehydration, whatever is causing diarrhea, vomiting, or other symptoms  should also be treated.  Document Released: 12/10/2005 Document Revised: 03/03/2012 Document Reviewed: 03/24/2009 [employer] Patient Information 2015 [employer]. This information is not intended to replace advice given to you by your health care provider. Make sure you discuss any questions you have with your health care provider.  

## 2015-07-25 ENCOUNTER — Emergency Department (HOSPITAL_COMMUNITY): Payer: No Typology Code available for payment source

## 2015-07-25 ENCOUNTER — Emergency Department (HOSPITAL_COMMUNITY)
Admission: EM | Admit: 2015-07-25 | Discharge: 2015-07-25 | Disposition: A | Discharge status: Home / Self Care | Payer: No Typology Code available for payment source | Attending: Emergency Medicine | Admitting: Emergency Medicine

## 2015-07-25 ENCOUNTER — Encounter (HOSPITAL_COMMUNITY): Payer: Self-pay | Admitting: Emergency Medicine

## 2015-07-25 DIAGNOSIS — Y9289 Other specified places as the place of occurrence of the external cause: Secondary | ICD-10-CM | POA: Insufficient documentation

## 2015-07-25 DIAGNOSIS — S92351A Displaced fracture of fifth metatarsal bone, right foot, initial encounter for closed fracture: Secondary | ICD-10-CM | POA: Diagnosis not present

## 2015-07-25 DIAGNOSIS — Z880 Allergy status to penicillin: Secondary | ICD-10-CM | POA: Insufficient documentation

## 2015-07-25 DIAGNOSIS — W1849XA Other slipping, tripping and stumbling without falling, initial encounter: Secondary | ICD-10-CM | POA: Diagnosis not present

## 2015-07-25 DIAGNOSIS — Y9301 Activity, walking, marching and hiking: Secondary | ICD-10-CM | POA: Insufficient documentation

## 2015-07-25 DIAGNOSIS — Y998 Other external cause status: Secondary | ICD-10-CM | POA: Diagnosis not present

## 2015-07-25 DIAGNOSIS — S92301A Fracture of unspecified metatarsal bone(s), right foot, initial encounter for closed fracture: Secondary | ICD-10-CM

## 2015-07-25 DIAGNOSIS — S99921A Unspecified injury of right foot, initial encounter: Secondary | ICD-10-CM | POA: Diagnosis present

## 2015-07-25 MED ORDER — OXYCODONE-ACETAMINOPHEN 5-325 MG PO TABS: 2.0000 | ORAL_TABLET | ORAL | Status: DC | PRN

## 2015-07-25 NOTE — Discharge Instructions (Signed)
Cast or Splint Care Follow up with orthopedics.  Take ibuprofen for pain and percocet for break through pain. Return for any numbness or tingling in the foot.  Casts and splints support injured limbs and keep bones from moving while they heal.  HOME CARE  Keep the cast or splint uncovered during the drying period.  A plaster cast can take 24 to 48 hours to dry.  A fiberglass cast will dry in less than 1 hour.  Do not rest the cast on anything harder than a pillow for 24 hours.  Do not put weight on your injured limb. Do not put pressure on the cast. Wait for your doctor's approval.  Keep the cast or splint dry.  Cover the cast or splint with a plastic bag during baths or wet weather.  If you have a cast over your chest and belly (trunk), take sponge baths until the cast is taken off.  If your cast gets wet, dry it with a towel or blow dryer. Use the cool setting on the blow dryer.  Keep your cast or splint clean. Wash a dirty cast with a damp cloth.  Do not put any objects under your cast or splint.  Do not scratch the skin under the cast with an object. If itching is a problem, use a blow dryer on a cool setting over the itchy area.  Do not trim or cut your cast.  Do not take out the padding from inside your cast.  Exercise your joints near the cast as told by your doctor.  Raise (elevate) your injured limb on 1 or 2 pillows for the first 1 to 3 days. GET HELP IF:  Your cast or splint cracks.  Your cast or splint is too tight or too loose.  You itch badly under the cast.  Your cast gets wet or has a soft spot.  You have a bad smell coming from the cast.  You get an object stuck under the cast.  Your skin around the cast becomes red or sore.  You have new or more pain after the cast is put on. GET HELP RIGHT AWAY IF:  You have fluid leaking through the cast.  You cannot move your fingers or toes.  Your fingers or toes turn blue or white or are cool, painful,  or puffy (swollen).  You have tingling or lose feeling (numbness) around the injured area.  You have bad pain or pressure under the cast.  You have trouble breathing or have shortness of breath.  You have chest pain. Document Released: 04/11/2011 Document Revised: 08/12/2013 Document Reviewed: 06/18/2013 [facility] Patient Information 2015 [place], [place]. This information is not intended to replace advice given to you by your health care provider. Make sure you discuss any questions you have with your health care provider.

## 2015-07-25 NOTE — ED Provider Notes (Signed)
She denies any numbness or tingling to the foot.CSN: [id number]     Arrival date & time 07/25/15  0849 History   First MD Initiated Contact with Patient 07/25/15 [phone]     Chief Complaint  Patient presents with  . Foot Pain     (Consider location/radiation/quality/duration/timing/severity/associated sxs/prior Treatment) Patient is a 59 y.o. female presenting with lower extremity pain. The history is provided by the patient. No language interpreter was used.  Foot Pain Associated symptoms include arthralgias and myalgias. Pertinent negatives include no fever.   Ms. Ringle is a 59 y.o female who presents with right foot swelling after walking with her dog and tripping over a tree root on the trail 2 days ago.  She was able to ambulate and run back to her car.  She states she has been standing at work.  It became worse and constant this morning when she woke up. She took a herbal pill for swelling with minimal relief. She denies applying any ice to the area but did soak the foot in alcohol and warm water.  She denies any numbness or tingling to the foot.   History reviewed. No pertinent past medical history. Past Surgical History  Procedure Laterality Date  . Partial hysterectomy     Family History  Problem Relation Age of Onset  . Cancer Other   . Hypertension Other    History  Substance Use Topics  . Smoking status: Never Smoker   . Smokeless tobacco: Not on file  . Alcohol Use: Yes     Comment: occ   OB History    No data available     Review of Systems  Constitutional: Negative for fever.  Musculoskeletal: Positive for myalgias and arthralgias.  Skin: Negative for color change and wound.  All other systems reviewed and are negative.     Allergies  Penicillins  Home Medications   Prior to Admission medications   Medication Sig Start Date End Date Taking? Authorizing Provider  OVER THE COUNTER MEDICATION Take 1 tablet by mouth 2 (two) times daily as needed (for  pain). *QBC*   Yes Historical Provider, MD  oxyCODONE-acetaminophen (PERCOCET/ROXICET) 5-325 MG per tablet Take 2 tablets by mouth every 4 (four) hours as needed for severe pain. 07/25/15   Dori Devino Patel-Mills, PA-C   BP 125/75 mmHg  Pulse 63  Temp(Src) 97.8 F (36.6 C) (Oral)  Resp 18  Ht  (1.626 m)  Wt 135 lb (61.236 kg)  BMI 23.16 kg/m2  SpO2 100% Physical Exam  Constitutional: She is oriented to person, place, and time. She appears well-developed and well-nourished.  HENT:  Head: Normocephalic and atraumatic.  Eyes: Conjunctivae are normal.  Neck: Neck supple.  Cardiovascular: Normal rate.   Pulmonary/Chest: Effort normal.  Musculoskeletal: Normal range of motion.  Neurological: She is alert and oriented to person, place, and time.  Skin: Skin is warm and dry.  Right foot: 2+ DP pulse. Able to flex and extend toes and dorsi/plantar flex ankle without difficulty. Tenderness to palpation, edema, and ecchymosis along the 4th and 5th metatarsals and 5th toe. No deformity. NVI.  Psychiatric: She has a normal mood and affect. Her behavior is normal.  Nursing note and vitals reviewed.   ED Course  Procedures (including critical care time) Labs Review Labs Reviewed - No data to display  Imaging Review Dg Foot Complete Right  07/25/2015   CLINICAL DATA:  Fall with pain and swelling.  Initial encounter.  EXAM: RIGHT FOOT COMPLETE - 3+  VIEW  COMPARISON:  None.  FINDINGS: Oblique fracture through the fifth metacarpal shaft with minimal dorsal displacement (3 mm at most). No articular surface continuation. No dislocation.  IMPRESSION: Mildly displaced fifth metacarpal shaft fracture.   Electronically Signed   By: Marnee Spring M.D.   On: 07/25/2015 09:42     EKG Interpretation None      MDM   Final diagnoses:  Metatarsal fracture, right, closed, initial encounter   Patient presents for right foot injury after trip and fall over tree root. Xray of the right foot shows oblique  mildly displaced (3mm at most) fracture through the 5th metatarsal shaft. She refused pain meds.  She was given ice, posterior splint, crutches, and ortho follow up. I discussed not bearing weight on the foot until seen by ortho. She can elevate the foot and I gave her percocet to go home with if needed. She verbally agrees with the plan.     Catha Gosselin, PA-C 07/25/15 1107  Pricilla Loveless, MD 07/25/15 1133

## 2015-07-25 NOTE — ED Notes (Signed)
Pt states that on Saturday she was running in the woods with her dog when she hit a root with her right foot.  Pt states that she has pain on top of her foot and notices swelling and bruising to her 5th metatarsal.

## 2015-09-22 ENCOUNTER — Emergency Department (HOSPITAL_COMMUNITY): Payer: No Typology Code available for payment source

## 2015-09-22 ENCOUNTER — Emergency Department (HOSPITAL_COMMUNITY)
Admission: EM | Admit: 2015-09-22 | Discharge: 2015-09-22 | Disposition: A | Discharge status: Home / Self Care | Payer: No Typology Code available for payment source | Attending: Emergency Medicine | Admitting: Emergency Medicine

## 2015-09-22 ENCOUNTER — Encounter (HOSPITAL_COMMUNITY): Payer: Self-pay | Admitting: Emergency Medicine

## 2015-09-22 DIAGNOSIS — S92301G Fracture of unspecified metatarsal bone(s), right foot, subsequent encounter for fracture with delayed healing: Secondary | ICD-10-CM

## 2015-09-22 DIAGNOSIS — Z880 Allergy status to penicillin: Secondary | ICD-10-CM | POA: Insufficient documentation

## 2015-09-22 DIAGNOSIS — X58XXXD Exposure to other specified factors, subsequent encounter: Secondary | ICD-10-CM | POA: Insufficient documentation

## 2015-09-22 DIAGNOSIS — S92351G Displaced fracture of fifth metatarsal bone, right foot, subsequent encounter for fracture with delayed healing: Secondary | ICD-10-CM | POA: Insufficient documentation

## 2015-09-22 MED ORDER — IBUPROFEN 800 MG PO TABS
800.0000 mg | ORAL_TABLET | Freq: Once | ORAL | Status: AC
  Administered 2015-09-22: 800 mg via ORAL
  Filled 2015-09-22: qty 1

## 2015-09-22 NOTE — ED Notes (Signed)
Pt verbalized understanding of discharge orders. 

## 2015-09-22 NOTE — ED Provider Notes (Signed)
CSN: [id number]     Arrival date & time 09/22/15  4098 History   First MD Initiated Contact with Patient 09/22/15 [phone]     Chief Complaint  Patient presents with  . Foot Pain     (Consider location/radiation/quality/duration/timing/severity/associated sxs/prior Treatment) Patient is a 59 y.o. female presenting with lower extremity pain.  Foot Pain This is a new problem. The current episode started yesterday. The problem occurs constantly. The problem has been gradually worsening. Pertinent negatives include no chest pain, no abdominal pain, no headaches and no shortness of breath. The symptoms are aggravated by walking. Nothing relieves the symptoms. She has tried nothing for the symptoms.    History reviewed. No pertinent past medical history. Past Surgical History  Procedure Laterality Date  . Partial hysterectomy     Family History  Problem Relation Age of Onset  . Cancer Other   . Hypertension Other    Social History  Substance Use Topics  . Smoking status: Never Smoker   . Smokeless tobacco: None  . Alcohol Use: Yes     Comment: occ   OB History    No data available     Review of Systems  Constitutional: Negative for fever.  HENT: Negative for sore throat.   Eyes: Negative for visual disturbance.  Respiratory: Negative for cough and shortness of breath.   Cardiovascular: Negative for chest pain.  Gastrointestinal: Negative for abdominal pain.  Genitourinary: Negative for difficulty urinating.  Musculoskeletal: Negative for back pain and neck pain.  Skin: Negative for rash.  Neurological: Negative for syncope and headaches.      Allergies  Penicillins  Home Medications   Prior to Admission medications   Medication Sig Start Date End Date Taking? Authorizing Provider  OVER THE COUNTER MEDICATION Take 1 tablet by mouth 2 (two) times daily as needed (for pain). *QBC*    Historical Provider, MD  oxyCODONE-acetaminophen (PERCOCET/ROXICET) 5-325 MG per tablet  Take 2 tablets by mouth every 4 (four) hours as needed for severe pain. 07/25/15   Hanna Patel-Mills, PA-C   BP 156/74 mmHg  Pulse 58  Temp(Src) 97.9 F (36.6 C)  Resp 14  Ht  (1.626 m)  Wt 140 lb (63.504 kg)  BMI 24.02 kg/m2  SpO2 100% Physical Exam  Constitutional: She is oriented to person, place, and time. She appears well-developed and well-nourished. No distress.  HENT:  Head: Normocephalic and atraumatic.  Eyes: Conjunctivae and EOM are normal.  Neck: Normal range of motion.  Cardiovascular: Normal rate, regular rhythm and intact distal pulses.   Pulmonary/Chest: Effort normal. No respiratory distress.  Musculoskeletal: She exhibits no edema.       Right ankle: She exhibits no swelling, no ecchymosis and no deformity.       Right foot: There is tenderness (dorsum of foot), bony tenderness and swelling (mild). There is normal capillary refill, no deformity and no laceration.  Neurological: She is alert and oriented to person, place, and time.  Skin: Skin is warm and dry. No rash noted. She is not diaphoretic. No erythema.  Nursing note and vitals reviewed.   ED Course  Procedures (including critical care time) Labs Review Labs Reviewed - No data to display  Imaging Review Dg Foot Complete Right  09/22/2015   CLINICAL DATA:  Persistent pain.  Recent fifth metatarsal fracture.  EXAM: RIGHT FOOT COMPLETE - 3+ VIEW  COMPARISON:  July 25, 2015  FINDINGS: Frontal, oblique, and lateral views were obtained. The obliquely oriented fracture of the  fifth metatarsal is again noted with alignment near anatomic. There is only modest callus formation in this area. There is no new fracture. No dislocation. There is spurring in the dorsal talonavicular joint. There is a spur along the inferior calcaneus.  IMPRESSION: Fifth metatarsal fracture again noted with alignment near anatomic and stable. Only modest evidence of healing compared to prior study. No new fracture. No dislocation.  Spurring in the talonavicular joint as well as along the inferior calcaneus noted.   Electronically Signed   By: Bretta Bang III M.D.   On: 09/22/2015 07:59   I have personally reviewed and evaluated these images and lab results as part of my medical decision-making.   EKG Interpretation None      MDM   Final diagnoses:  Fracture of fifth metatarsal bone, right, with delayed healing, subsequent encounter   59 year old female with history of right fifth metatarsal fracture 8 weeks ago presents with concern of right foot pain after running yesterday.  Pt nv intact.  XR ordered showed fifth metatarsal fracture, with modest evidence of healing.  There is no evidence of new fracture, however symptoms may represent new injury or exacerbation of pain from running on unhealed fracture.  Given pain exacerbation and concern for delayed healing or reinjury recommend patient remain nonweightbearing until instructed by her orthopedic physician. Provided crutches (has boot at home). Patient discharged in stable condition with understanding of reasons to return.     Alvira Monday, MD 09/22/15 [phone]

## 2015-09-22 NOTE — Discharge Instructions (Signed)
Wear your boot as provided, do not bear weight until instructed by your physician.

## 2015-09-22 NOTE — ED Notes (Signed)
Pt states she broke her foot 8-9 weeks ago. She went for a run yesterday and now she has pain and swelling in her foot.

## 2016-03-02 ENCOUNTER — Encounter (HOSPITAL_COMMUNITY): Payer: Self-pay | Admitting: Oncology

## 2016-03-02 ENCOUNTER — Emergency Department (HOSPITAL_COMMUNITY)
Admission: EM | Admit: 2016-03-02 | Discharge: 2016-03-03 | Disposition: A | Discharge status: Home / Self Care | Payer: No Typology Code available for payment source | Attending: Emergency Medicine | Admitting: Emergency Medicine

## 2016-03-02 DIAGNOSIS — J019 Acute sinusitis, unspecified: Secondary | ICD-10-CM | POA: Insufficient documentation

## 2016-03-02 DIAGNOSIS — Z79899 Other long term (current) drug therapy: Secondary | ICD-10-CM | POA: Insufficient documentation

## 2016-03-02 DIAGNOSIS — J029 Acute pharyngitis, unspecified: Secondary | ICD-10-CM | POA: Insufficient documentation

## 2016-03-02 DIAGNOSIS — Z880 Allergy status to penicillin: Secondary | ICD-10-CM | POA: Insufficient documentation

## 2016-03-02 NOTE — ED Notes (Signed)
Per pt she has had cough, body aches, chills, sinus drainage and fever x 1.5 weeks.  Pt used mucinex at home w/o relief and it also upset her stomach so she stopped taking it.

## 2016-03-03 MED ORDER — BENZONATATE 100 MG PO CAPS
100.0000 mg | ORAL_CAPSULE | Freq: Once | ORAL | Status: AC
  Administered 2016-03-03: 100 mg via ORAL
  Filled 2016-03-03: qty 1

## 2016-03-03 MED ORDER — AZITHROMYCIN 250 MG PO TABS: 250.0000 mg | ORAL_TABLET | Freq: Every day | ORAL | Status: DC

## 2016-03-03 MED ORDER — BENZONATATE 100 MG PO CAPS: 100.0000 mg | ORAL_CAPSULE | Freq: Three times a day (TID) | ORAL | Status: DC

## 2016-03-03 MED ORDER — AZITHROMYCIN 250 MG PO TABS
500.0000 mg | ORAL_TABLET | Freq: Once | ORAL | Status: AC
  Administered 2016-03-03: 500 mg via ORAL
  Filled 2016-03-03: qty 2

## 2016-03-03 NOTE — Discharge Instructions (Signed)

## 2016-03-03 NOTE — ED Provider Notes (Signed)
CSN: [id number]     Arrival date & time 03/02/16  2116 History   First MD Initiated Contact with Patient 03/03/16 0111     Chief Complaint  Patient presents with  . Flu like sx      (Consider location/radiation/quality/duration/timing/severity/associated sxs/prior Treatment) Patient is a 60 y.o. female presenting with cough. The history is provided by the patient. No language interpreter was used.  Cough Cough characteristics:  Productive Sputum characteristics:  Nondescript Severity:  Moderate Onset quality:  Gradual Duration:  7 days Timing:  Constant Progression:  Worsening Chronicity:  New Smoker: no   Context: upper respiratory infection   Relieved by:  Nothing Worsened by:  Nothing tried Ineffective treatments:  None tried Associated symptoms: sinus congestion and sore throat   Associated symptoms: no shortness of breath   Pt complains of sinus congestion and pain.   Pt reports she began feeling sick 10 days ago.    History reviewed. No pertinent past medical history. Past Surgical History  Procedure Laterality Date  . Partial hysterectomy     Family History  Problem Relation Age of Onset  . Cancer Other   . Hypertension Other    Social History  Substance Use Topics  . Smoking status: Never Smoker   . Smokeless tobacco: None  . Alcohol Use: Yes     Comment: occ   OB History    No data available     Review of Systems  HENT: Positive for sore throat.   Respiratory: Positive for cough. Negative for shortness of breath.   All other systems reviewed and are negative.     Allergies  Penicillins  Home Medications   Prior to Admission medications   Medication Sig Start Date End Date Taking? Authorizing Provider  Ginger Root POWD by Does not apply route.   Yes Historical Provider, MD  guaiFENesin (ROBITUSSIN) 100 MG/5ML liquid Take 200 mg by mouth 3 (three) times daily as needed for cough.   Yes Historical Provider, MD  Turmeric POWD by Does not apply  route.   Yes Historical Provider, MD  azithromycin (ZITHROMAX) 250 MG tablet Take 1 tablet (250 mg total) by mouth daily. Take first 2 tablets together, then 1 every day until finished. 03/03/16   Elson AreasLeslie K Daundre Biel, PA-C  benzonatate (TESSALON) 100 MG capsule Take 1 capsule (100 mg total) by mouth every 8 (eight) hours. 03/03/16   Elson AreasLeslie K Elyjah Hazan, PA-C  OVER THE COUNTER MEDICATION Take 1 tablet by mouth 2 (two) times daily as needed (for pain). *QBC*    Historical Provider, MD  oxyCODONE-acetaminophen (PERCOCET/ROXICET) 5-325 MG per tablet Take 2 tablets by mouth every 4 (four) hours as needed for severe pain. Patient not taking: Reported on 03/02/2016 07/25/15   Hanna Patel-Mills, PA-C   BP 144/91 mmHg  Pulse 60  Temp(Src) 99.4 F (37.4 C) (Oral)  Resp 16  Ht 5\' 4"  (1.626 m)  Wt 58.968 kg  BMI 22.30 kg/m2  SpO2 99% Physical Exam  Constitutional: She is oriented to person, place, and time. She appears well-developed and well-nourished.  HENT:  Head: Normocephalic and atraumatic.  Eyes: EOM are normal. Pupils are equal, round, and reactive to light.  Neck: Normal range of motion.  Cardiovascular: Normal rate, regular rhythm and normal heart sounds.   Pulmonary/Chest: Effort normal and breath sounds normal.  Abdominal: She exhibits no distension.  Musculoskeletal: Normal range of motion.  Neurological: She is alert and oriented to person, place, and time.  Skin: Skin is warm.  Psychiatric: She has a normal mood and affect.  Nursing note and vitals reviewed.   ED Course  Procedures (including critical care time) Labs Review Labs Reviewed - No data to display  Imaging Review No results found. I have personally reviewed and evaluated these images and lab results as part of my medical decision-making.   EKG Interpretation None      MDM   Final diagnoses:  Acute sinusitis, recurrence not specified, unspecified location    Meds ordered this encounter  Medications  . guaiFENesin  (ROBITUSSIN) 100 MG/5ML liquid    Sig: Take 200 mg by mouth 3 (three) times daily as needed for cough.  . Turmeric POWD    Sig: by Does not apply route.  . Ginger Root POWD    Sig: by Does not apply route.  . benzonatate (TESSALON) 100 MG capsule    Sig: Take 1 capsule (100 mg total) by mouth every 8 (eight) hours.    Dispense:  21 capsule    Refill:  0    Order Specific Question:  Supervising Provider    Answer:  MILLER, BRIAN [[id number]]  . azithromycin (ZITHROMAX) 250 MG tablet    Sig: Take 1 tablet (250 mg total) by mouth daily. Take first 2 tablets together, then 1 every day until finished.    Dispense:  6 tablet    Refill:  0    Order Specific Question:  Supervising Provider    Answer:  MILLER, BRIAN [[id number]]  . benzonatate (TESSALON) capsule 100 mg    Sig:   . azithromycin (ZITHROMAX) tablet 500 mg    Sig:   An After Visit Summary was printed and given to the patient.    Lonia Skinner [place], PA-C 03/03/16 1610  April Palumbo, MD 03/03/16 [phone]

## 2016-03-13 ENCOUNTER — Emergency Department (HOSPITAL_COMMUNITY)
Admission: EM | Admit: 2016-03-13 | Discharge: 2016-03-13 | Disposition: A | Discharge status: Home / Self Care | Payer: Self-pay | Attending: Emergency Medicine | Admitting: Emergency Medicine

## 2016-03-13 ENCOUNTER — Encounter (HOSPITAL_COMMUNITY): Payer: Self-pay | Admitting: Emergency Medicine

## 2016-03-13 ENCOUNTER — Emergency Department (HOSPITAL_COMMUNITY): Payer: Self-pay

## 2016-03-13 DIAGNOSIS — B9789 Other viral agents as the cause of diseases classified elsewhere: Secondary | ICD-10-CM

## 2016-03-13 DIAGNOSIS — Z79899 Other long term (current) drug therapy: Secondary | ICD-10-CM | POA: Insufficient documentation

## 2016-03-13 DIAGNOSIS — Z880 Allergy status to penicillin: Secondary | ICD-10-CM | POA: Insufficient documentation

## 2016-03-13 DIAGNOSIS — J069 Acute upper respiratory infection, unspecified: Secondary | ICD-10-CM | POA: Insufficient documentation

## 2016-03-13 MED ORDER — PREDNISONE 20 MG PO TABS: 40.0000 mg | ORAL_TABLET | Freq: Every day | ORAL | Status: DC

## 2016-03-13 MED ORDER — IBUPROFEN 800 MG PO TABS
800.0000 mg | ORAL_TABLET | Freq: Once | ORAL | Status: AC
  Administered 2016-03-13: 800 mg via ORAL
  Filled 2016-03-13: qty 1

## 2016-03-13 MED ORDER — PREDNISONE 20 MG PO TABS
60.0000 mg | ORAL_TABLET | Freq: Once | ORAL | Status: AC
  Administered 2016-03-13: 60 mg via ORAL
  Filled 2016-03-13: qty 3

## 2016-03-13 MED ORDER — IPRATROPIUM-ALBUTEROL 0.5-2.5 (3) MG/3ML IN SOLN
3.0000 mL | Freq: Once | RESPIRATORY_TRACT | Status: AC
  Administered 2016-03-13: 3 mL via RESPIRATORY_TRACT
  Filled 2016-03-13: qty 3

## 2016-03-13 MED ORDER — HYDROCODONE-HOMATROPINE 5-1.5 MG/5ML PO SYRP: 5.0000 mL | ORAL_SOLUTION | Freq: Three times a day (TID) | ORAL | Status: DC | PRN

## 2016-03-13 MED ORDER — IBUPROFEN 600 MG PO TABS: 600.0000 mg | ORAL_TABLET | Freq: Four times a day (QID) | ORAL | Status: DC | PRN

## 2016-03-13 MED ORDER — ALBUTEROL SULFATE HFA 108 (90 BASE) MCG/ACT IN AERS
2.0000 | INHALATION_SPRAY | Freq: Once | RESPIRATORY_TRACT | Status: AC
  Administered 2016-03-13: 2 via RESPIRATORY_TRACT
  Filled 2016-03-13: qty 6.7

## 2016-03-13 NOTE — ED Notes (Signed)
Pt states she has been having cough, fever x 2 days.

## 2016-03-13 NOTE — ED Notes (Signed)
Called x 1 w/o answer.  

## 2016-03-13 NOTE — ED Provider Notes (Signed)
CSN: [id number]     Arrival date & time 03/13/16  1906 History  By signing my name below, I, Marisue Humble, attest that this documentation has been prepared under the direction and in the presence of non-physician practitioner, Antony Madura, PA-C. Electronically Signed: Marisue Humble, Scribe. 03/13/2016. 10:03 PM.   Chief Complaint  Patient presents with  . Cough  . Fever   The history is provided by the patient. No language interpreter was used.   HPI Comments:  Alejandra Scott is a 60 y.o. female with no pertinent PMHx who presents to the Emergency Department complaining of intermittent fever, tmax 102, for the past two days. Pt reports associated dry cough, central chest burning, and headache for ~2 weeks. She treated with Tylenol with mild relief. Pt was seen in the ER with similar symptoms 10 days ago; she states those symptoms have never completely alleviated. Pt was discharged with Robitussin, Tessalon, and Zithromax; she finished the antibiotic but did not finish taking the cough medicine. Pt reports sick contact with similar symptoms. Pt denies vomiting, diarrhea or h/o reflux.  History reviewed. No pertinent past medical history. Past Surgical History  Procedure Laterality Date  . Partial hysterectomy     Family History  Problem Relation Age of Onset  . Cancer Other   . Hypertension Other    Social History  Substance Use Topics  . Smoking status: Never Smoker   . Smokeless tobacco: None  . Alcohol Use: Yes     Comment: occ   OB History    No data available      Review of Systems  Constitutional: Positive for fever.  Respiratory: Positive for cough and chest tightness.   Gastrointestinal: Negative for vomiting and diarrhea.  Neurological: Positive for headaches.  All other systems reviewed and are negative.   Allergies  Penicillins  Home Medications   Prior to Admission medications   Medication Sig Start Date End Date Taking? Authorizing Provider   azithromycin (ZITHROMAX) 250 MG tablet Take 1 tablet (250 mg total) by mouth daily. Take first 2 tablets together, then 1 every day until finished. 03/03/16   Elson Areas, PA-C  benzonatate (TESSALON) 100 MG capsule Take 1 capsule (100 mg total) by mouth every 8 (eight) hours. 03/03/16   Elson Areas, PA-C  Ginger Root POWD by Does not apply route.    Historical Provider, MD  guaiFENesin (ROBITUSSIN) 100 MG/5ML liquid Take 200 mg by mouth 3 (three) times daily as needed for cough.    Historical Provider, MD  HYDROcodone-homatropine (HYCODAN) 5-1.5 MG/5ML syrup Take 5 mLs by mouth every 8 (eight) hours as needed for cough. 03/13/16   Antony Madura, PA-C  ibuprofen (ADVIL,MOTRIN) 600 MG tablet Take 1 tablet (600 mg total) by mouth every 6 (six) hours as needed for fever, headache, mild pain or moderate pain. 03/13/16   Antony Madura, PA-C  OVER THE COUNTER MEDICATION Take 1 tablet by mouth 2 (two) times daily as needed (for pain). *QBC*    Historical Provider, MD  oxyCODONE-acetaminophen (PERCOCET/ROXICET) 5-325 MG per tablet Take 2 tablets by mouth every 4 (four) hours as needed for severe pain. Patient not taking: Reported on 03/02/2016 07/25/15   Catha Gosselin, PA-C  predniSONE (DELTASONE) 20 MG tablet Take 2 tablets (40 mg total) by mouth daily. 03/13/16   Antony Madura, PA-C  Turmeric POWD by Does not apply route.    Historical Provider, MD   BP 140/70 mmHg  Pulse 55  Temp(Src) 99.9 F (37.7  C) (Oral)  Resp 16  SpO2 99%   Physical Exam  Constitutional: She is oriented to person, place, and time. She appears well-developed and well-nourished. No distress.  Nontoxic appearing  HENT:  Head: Normocephalic and atraumatic.  Eyes: Conjunctivae and EOM are normal. No scleral icterus.  Neck: Normal range of motion.  Cardiovascular: Normal rate, regular rhythm and intact distal pulses.   Pulmonary/Chest: Effort normal and breath sounds normal. No respiratory distress. She has no wheezes. She has no  rales.  Dry, harsh, nonproductive cough appreciated. Chest expansion symmetric. Lungs CTAB.  Musculoskeletal: Normal range of motion.  Neurological: She is alert and oriented to person, place, and time. She exhibits normal muscle tone. Coordination normal.  Patient moving all extremities.  Skin: Skin is warm and dry. No rash noted. She is not diaphoretic. No erythema. No pallor.  Psychiatric: She has a normal mood and affect. Her behavior is normal.  Nursing note and vitals reviewed.   ED Course  Procedures  DIAGNOSTIC STUDIES:  Oxygen Saturation is 100% on RA, normal by my interpretation.    COORDINATION OF CARE:  9:59 PM Will administer Ibuprofen and cough medicine. Will evaluate chest x-ray when imaging returns. Discussed treatment plan with pt at bedside and pt agreed to plan.  Labs Review Labs Reviewed - No data to display  Imaging Review Dg Chest 2 View  03/13/2016  CLINICAL DATA:  Cough, congestion, shortness of breath, and wheezing for 3 weeks. Fever for 2 days. EXAM: CHEST  2 VIEW COMPARISON:  None. FINDINGS: The cardiac silhouette is upper limits of normal in size. The lungs are well inflated without evidence of confluent airspace opacity, edema, pleural effusion, or pneumothorax. No acute osseous abnormality is identified. IMPRESSION: No active cardiopulmonary disease. Electronically Signed   By: Sebastian AcheAllen  Grady M.D.   On: 03/13/2016 22:47     I have personally reviewed and evaluated these images and lab results as part of my medical decision-making.   EKG Interpretation None      Medications  ibuprofen (ADVIL,MOTRIN) tablet 800 mg (800 mg Oral Given 03/13/16 2244)  ipratropium-albuterol (DUONEB) 0.5-2.5 (3) MG/3ML nebulizer solution 3 mL (3 mLs Nebulization Given 03/13/16 2244)  predniSONE (DELTASONE) tablet 60 mg (60 mg Oral Given 03/13/16 2244)  albuterol (PROVENTIL HFA;VENTOLIN HFA) 108 (90 Base) MCG/ACT inhaler 2 puff (2 puffs Inhalation Given 03/13/16 2345)    MDM    Final diagnoses:  Viral URI with cough    Pt CXR negative for acute infiltrate. Patient's symptoms are consistent with URI, likely viral etiology. Discussed that antibiotics are not indicated for viral infections. Pt will be discharged with symptomatic treatment. Patient verbalizes understanding and is agreeable with plan. Pt is hemodynamically stable and in NAD prior to discharge.  I personally performed the services described in this documentation, which was scribed in my presence. The recorded information has been reviewed and is accurate.    Filed Vitals:   03/13/16 1955 03/13/16 2242  BP: 165/97 140/70  Pulse: 59 55  Temp: 100.3 F (37.9 C) 99.9 F (37.7 C)  TempSrc: Oral Oral  Resp: 18 16  SpO2: 100% 99%      Antony MaduraKelly Minahil Quinlivan, PA-C 03/13/16 2349  Raeford RazorStephen Kohut, MD 03/16/16 [phone]

## 2016-03-13 NOTE — Discharge Instructions (Signed)
Upper Respiratory Infection, Adult Most upper respiratory infections (URIs) are a viral infection of the air passages leading to the lungs. A URI affects the nose, throat, and upper air passages. The most common type of URI is nasopharyngitis and is typically referred to as "the common cold." URIs run their course and usually go away on their own. Most of the time, a URI does not require medical attention, but sometimes a bacterial infection in the upper airways can follow a viral infection. This is called a secondary infection. Sinus and middle ear infections are common types of secondary upper respiratory infections. Bacterial pneumonia can also complicate a URI. A URI can worsen asthma and chronic obstructive pulmonary disease (COPD). Sometimes, these complications can require emergency medical care and may be life threatening.  CAUSES Almost all URIs are caused by viruses. A virus is a type of germ and can spread from one person to another.  RISKS FACTORS You may be at risk for a URI if:   You smoke.   You have chronic heart or lung disease.  You have a weakened defense (immune) system.   You are very young or very old.   You have nasal allergies or asthma.  You work in crowded or poorly ventilated areas.  You work in health care facilities or schools. SIGNS AND SYMPTOMS  Symptoms typically develop 2-3 days after you come in contact with a cold virus. Most viral URIs last 7-10 days. However, viral URIs from the influenza virus (flu virus) can last 14-18 days and are typically more severe. Symptoms may include:   Runny or stuffy (congested) nose.   Sneezing.   Cough.   Sore throat.   Headache.   Fatigue.   Fever.   Loss of appetite.   Pain in your forehead, behind your eyes, and over your cheekbones (sinus pain).  Muscle aches.  DIAGNOSIS  Your health care provider may diagnose a URI by:  Physical exam.  Tests to check that your symptoms are not due to  another condition such as:  Strep throat.  Sinusitis.  Pneumonia.  Asthma. TREATMENT  A URI goes away on its own with time. It cannot be cured with medicines, but medicines may be prescribed or recommended to relieve symptoms. Medicines may help:  Reduce your fever.  Reduce your cough.  Relieve nasal congestion. HOME CARE INSTRUCTIONS   Take medicines only as directed by your health care provider.   Gargle warm saltwater or take cough drops to comfort your throat as directed by your health care provider.  Use a warm mist humidifier or inhale steam from a shower to increase air moisture. This may make it easier to breathe.  Drink enough fluid to keep your urine clear or pale yellow.   Eat soups and other clear broths and maintain good nutrition.   Rest as needed.   Return to work when your temperature has returned to normal or as your health care provider advises. You may need to stay home longer to avoid infecting others. You can also use a face mask and careful hand washing to prevent spread of the virus.  Increase the usage of your inhaler if you have asthma.   Do not use any tobacco products, including cigarettes, chewing tobacco, or electronic cigarettes. If you need help quitting, ask your health care provider. PREVENTION  The best way to protect yourself from getting a cold is to practice good hygiene.   Avoid oral or hand contact with people with cold   symptoms.   Wash your hands often if contact occurs.  There is no clear evidence that vitamin C, vitamin E, echinacea, or exercise reduces the chance of developing a cold. However, it is always recommended to get plenty of rest, exercise, and practice good nutrition.  SEEK MEDICAL CARE IF:   You are getting worse rather than better.   Your symptoms are not controlled by medicine.   You have chills.  You have worsening shortness of breath.  You have brown or red mucus.  You have yellow or brown nasal  discharge.  You have pain in your face, especially when you bend forward.  You have a fever.  You have swollen neck glands.  You have pain while swallowing.  You have white areas in the back of your throat. SEEK IMMEDIATE MEDICAL CARE IF:   You have severe or persistent:  Headache.  Ear pain.  Sinus pain.  Chest pain.  You have chronic lung disease and any of the following:  Wheezing.  Prolonged cough.  Coughing up blood.  A change in your usual mucus.  You have a stiff neck.  You have changes in your:  Vision.  Hearing.  Thinking.  Mood. MAKE SURE YOU:   Understand these instructions.  Will watch your condition.  Will get help right away if you are not doing well or get worse.   This information is not intended to replace advice given to you by your health care provider. Make sure you discuss any questions you have with your health care provider.   Document Released: 06/05/2001 Document Revised: 04/26/2015 Document Reviewed: 03/17/2014 Elsevier Interactive Patient Education 2016 [employer]  

## 2018-05-01 ENCOUNTER — Emergency Department (HOSPITAL_COMMUNITY)
Admission: EM | Admit: 2018-05-01 | Discharge: 2018-05-01 | Disposition: A | Discharge status: Home / Self Care | Payer: BLUE CROSS/BLUE SHIELD | Attending: Emergency Medicine | Admitting: Emergency Medicine

## 2018-05-01 ENCOUNTER — Encounter (HOSPITAL_COMMUNITY): Payer: Self-pay | Admitting: Emergency Medicine

## 2018-05-01 ENCOUNTER — Emergency Department (HOSPITAL_COMMUNITY): Payer: BLUE CROSS/BLUE SHIELD

## 2018-05-01 DIAGNOSIS — Y92481 Parking lot as the place of occurrence of the external cause: Secondary | ICD-10-CM | POA: Insufficient documentation

## 2018-05-01 DIAGNOSIS — W1830XA Fall on same level, unspecified, initial encounter: Secondary | ICD-10-CM | POA: Insufficient documentation

## 2018-05-01 DIAGNOSIS — T148XXA Other injury of unspecified body region, initial encounter: Secondary | ICD-10-CM

## 2018-05-01 DIAGNOSIS — Y999 Unspecified external cause status: Secondary | ICD-10-CM | POA: Diagnosis not present

## 2018-05-01 DIAGNOSIS — S50312A Abrasion of left elbow, initial encounter: Secondary | ICD-10-CM | POA: Diagnosis not present

## 2018-05-01 DIAGNOSIS — W19XXXA Unspecified fall, initial encounter: Secondary | ICD-10-CM

## 2018-05-01 DIAGNOSIS — Y9301 Activity, walking, marching and hiking: Secondary | ICD-10-CM | POA: Insufficient documentation

## 2018-05-01 DIAGNOSIS — S80211A Abrasion, right knee, initial encounter: Secondary | ICD-10-CM | POA: Diagnosis not present

## 2018-05-01 DIAGNOSIS — M25522 Pain in left elbow: Secondary | ICD-10-CM

## 2018-05-01 DIAGNOSIS — Z043 Encounter for examination and observation following other accident: Secondary | ICD-10-CM | POA: Diagnosis present

## 2018-05-01 DIAGNOSIS — M25561 Pain in right knee: Secondary | ICD-10-CM

## 2018-05-01 DIAGNOSIS — Z23 Encounter for immunization: Secondary | ICD-10-CM | POA: Diagnosis not present

## 2018-05-01 MED ORDER — TETANUS-DIPHTH-ACELL PERTUSSIS 5-2.5-18.5 LF-MCG/0.5 IM SUSP
0.5000 mL | Freq: Once | INTRAMUSCULAR | Status: AC
  Administered 2018-05-01: 0.5 mL via INTRAMUSCULAR
  Filled 2018-05-01: qty 0.5

## 2018-05-01 MED ORDER — CYCLOBENZAPRINE HCL 10 MG PO TABS: 10.0000 mg | ORAL_TABLET | Freq: Two times a day (BID) | ORAL | 0 refills | Status: DC | PRN

## 2018-05-01 NOTE — ED Triage Notes (Signed)
Patient here from home with complaints of fall. Reports right knee pain with abrasions and left elbow pain with abrasions.

## 2018-05-01 NOTE — ED Provider Notes (Signed)
[facility]-EMERGENCY DEPT Provider Note   CSN: [id number] Arrival date & time: 05/01/18  1458     History   Chief Complaint Chief Complaint  Patient presents with  . Fall  . Knee Pain  . Extremity Laceration    HPI Alejandra Scott is a 62 y.o. female with no significant past medical history who presents to the emergency department with a chief complaint of fall.  The patient reports a mechanical fall that occurred when she tripped over a pull in a parking lot this afternoon.  She reports that she fell forward and landed on her right knee and left elbow.  Pain has been constant and is worse with ambulation and improved with nonweightbearing.  Characterizes the pain as throbbing.  She denies hitting her head, LOC, nausea, or emesis.  She was able to get up from the fall and has been ambulatory since the injury, but it is painful.  She denies right upper extremity or left lower extremity pain.  No numbness or weakness.  No history of right knee or left elbow surgery or injuries.  She does not take any blood thinners.  No treatment prior to arrival.  She thinks that her tetanus was updated 9 to 10 years ago.  She is a runner and ran 7 miles this morning prior to the injury.  The history is provided by the patient. No language interpreter was used.  Fall  Pertinent negatives include no chest pain, no abdominal pain and no shortness of breath.  Knee Pain      History reviewed. No pertinent past medical history.  Patient Active Problem List   Diagnosis Date Noted  . FRACTURE, TOE 09/05/2009  . STRESS FRACTURE OF THE METATARSALS 03/29/2009  . METATARSALGIA 03/09/2009  . FOOT PAIN 03/09/2009    Past Surgical History:  Procedure Laterality Date  . PARTIAL HYSTERECTOMY       OB History   None      Home Medications    Prior to Admission medications   Medication Sig Start Date End Date Taking? Authorizing Provider  azithromycin (ZITHROMAX) 250 MG  tablet Take 1 tablet (250 mg total) by mouth daily. Take first 2 tablets together, then 1 every day until finished. 03/03/16   Elson Areas, PA-C  benzonatate (TESSALON) 100 MG capsule Take 1 capsule (100 mg total) by mouth every 8 (eight) hours. 03/03/16   Elson Areas, PA-C  cyclobenzaprine (FLEXERIL) 10 MG tablet Take 1 tablet (10 mg total) by mouth 2 (two) times daily as needed for muscle spasms. 05/01/18   Parisha Beaulac A, PA-C  Ginger Root POWD by Does not apply route.    [provider]  guaiFENesin (ROBITUSSIN) 100 MG/5ML liquid Take 200 mg by mouth 3 (three) times daily as needed for cough.    [provider]  HYDROcodone-homatropine (HYCODAN) 5-1.5 MG/5ML syrup Take 5 mLs by mouth every 8 (eight) hours as needed for cough. 03/13/16   Antony Madura, PA-C  ibuprofen (ADVIL,MOTRIN) 600 MG tablet Take 1 tablet (600 mg total) by mouth every 6 (six) hours as needed for fever, headache, mild pain or moderate pain. 03/13/16   Antony Madura, PA-C  OVER THE COUNTER MEDICATION Take 1 tablet by mouth 2 (two) times daily as needed (for pain). *QBC*    [provider]  oxyCODONE-acetaminophen (PERCOCET/ROXICET) 5-325 MG per tablet Take 2 tablets by mouth every 4 (four) hours as needed for severe pain. Patient not taking: Reported on 03/02/2016 07/25/15  Patel-Mills, Hanna, PA-C  predniSONE (DELTASONE) 20 MG tablet Take 2 tablets (40 mg total) by mouth daily. 03/13/16   Antony Madura, PA-C  Turmeric POWD by Does not apply route.    [provider]    Family History Family History  Problem Relation Age of Onset  . Cancer Other   . Hypertension Other     Social History Social History   Tobacco Use  . Smoking status: Never Smoker  . Smokeless tobacco: Never Used  Substance Use Topics  . Alcohol use: Yes    Comment: occ  . Drug use: No     Allergies   Penicillins   Review of Systems Review of Systems  Constitutional: Negative for activity change.    Respiratory: Negative for shortness of breath.   Cardiovascular: Negative for chest pain.  Gastrointestinal: Negative for abdominal pain.  Musculoskeletal: Positive for arthralgias, gait problem and myalgias. Negative for back pain and joint swelling.  Skin: Positive for wound. Negative for rash.   Physical Exam Updated Vital Signs BP 111/79 (BP Location: Left Arm)   Pulse 70   Temp 98.5 F (36.9 C) (Oral)   Resp 20   SpO2 100%   Physical Exam  Constitutional: No distress.  HENT:  Head: Normocephalic.  Eyes: Conjunctivae are normal.  Neck: Neck supple.  Cardiovascular: Normal rate and regular rhythm. Exam reveals no gallop and no friction rub.  No murmur heard. Pulmonary/Chest: Effort normal. No respiratory distress.  Abdominal: Soft. She exhibits no distension.  Musculoskeletal: Normal range of motion. She exhibits tenderness. She exhibits no edema or deformity.  Superficial abrasions noted over the left posterior elbow and right anterior knee.  No surrounding erythema, edema, or warmth.  Full active and passive range of motion of the bilateral shoulders, elbows, wrists, hips, knees, and ankles.  No midline tenderness to the spinous processes of the cervical, thoracic, or lumbar spine.   She has mild tenderness to palpation to the musculature of the right hip.  No crepitus, deformities, or step-offs.  Pain with extension of the right knee.   Radial, DP, PT pulses are 2+ and symmetric.  Sensation is intact throughout.  Antalgic gait.  Able to move all digits of the bilateral upper and lower extremities without difficulty.  Neurological: She is alert.  Skin: Skin is warm. No rash noted.  Psychiatric: Her behavior is normal.  Nursing note and vitals reviewed.    ED Treatments / Results  Labs (all labs ordered are listed, but only abnormal results are displayed) Labs Reviewed - No data to display  EKG None  Radiology Dg Elbow Complete Left  Result Date:  05/01/2018 CLINICAL DATA:  Fall EXAM: LEFT ELBOW - COMPLETE 3+ VIEW COMPARISON:  None. FINDINGS: There is no evidence of fracture, dislocation, or joint effusion. There is no evidence of arthropathy or other focal bone abnormality. Soft tissues are unremarkable. IMPRESSION: Negative. Electronically Signed   By: Deatra Robinson M.D.   On: 05/01/2018 15:58   Dg Knee Complete 4 Views Right  Result Date: 05/01/2018 CLINICAL DATA:  Fall.  Pain. EXAM: RIGHT KNEE - COMPLETE 4+ VIEW COMPARISON:  None. FINDINGS: No evidence of fracture or dislocation. Density in the suprapatellar bursa suggests small effusion. No lipohemarthrosis. Joint spaces are preserved. IMPRESSION: No fracture or dislocation.  Small effusion. Electronically Signed   By: Elsie Stain M.D.   On: 05/01/2018 15:48    Procedures Procedures (including critical care time)  Medications Ordered in ED Medications  Tdap (BOOSTRIX) injection  0.5 mL (has no administration in time range)     Initial Impression / Assessment and Plan / ED Course  I have reviewed the triage vital signs and the nursing notes.  Pertinent labs & imaging results that were available during my care of the patient were reviewed by me and considered in my medical decision making (see chart for details).     62 year old female with no significant past medical history presenting after mechanical fall.  She endorses right knee and left elbow pain since the injury.  X-rays are negative.  Superficial abrasions are present over the right anterior knee and left posterior elbow.  Wound care provided in the ED.  Tetanus updated today.  No focal neurologic findings on exam.  She also endorses mild right hip pain that is worse with ambulation.  Doubt hip fracture and no focal findings on exam.  Will discharge the patient to home with crutches, right knee sleeve, wound care instructions, and Flexeril.  Anti-inflammatories recommended for pain control.  She is hemodynamically stable and  in no acute distress.  The patient is safe for discharge to home with outpatient follow-up at this time.  Final Clinical Impressions(s) / ED Diagnoses   Final diagnoses:  Fall, initial encounter  Acute pain of right knee  Left elbow pain  Superficial abrasion    ED Discharge Orders        Ordered    cyclobenzaprine (FLEXERIL) 10 MG tablet  2 times daily PRN     05/01/18 1722       Malike Foglio A, PA-C 05/01/18 1724    Shaune Pollack, MD 05/02/18 [phone]

## 2018-05-01 NOTE — Discharge Instructions (Addendum)
Thank you for allowing me to provide your care today in the emergency department.  Your x-rays were negative for fracture/broken bones.  Your tetanus was updated today.  Wear the knee sleeve to provide compression to help with pain to your right knee.  Use the crutches as needed until you are able to bear weight without significant pain on the right foot.  When you are sitting and resting, elevate the right leg above the level of your heart to help with pain and swelling.  Apply ice for 15 to 20 minutes up to 3-4 times per day to any areas that are sore.  Take 600 mg of ibuprofen with food or 650 mg of Tylenol once every 6 hours for pain control.  You can also use topical over-the-counter products such as lidocaine cream to help with pain.   Take 1 tablet of Flexeril up to 2 times daily to help with muscle pain and spasms.  Use caution with this medication because it can make you drowsy if you have to work or drive.  If your pain does not start to improve within the next week, you can follow up with [facility] for re-evaluation.  If you develop significantly worsening symptoms, including another fall or injury, new weakness or numbness in the arms or legs, or if any of the skin around the wounds becomes red, hot, swollen, or if you develop fever, or chills, return to the emergency room for re-evaluation.

## 2018-05-01 NOTE — ED Notes (Signed)
Patient declined discharge vital signs. 

## 2018-05-12 ENCOUNTER — Emergency Department (HOSPITAL_COMMUNITY)
Admission: EM | Admit: 2018-05-12 | Discharge: 2018-05-13 | Disposition: A | Discharge status: Home / Self Care | Payer: BLUE CROSS/BLUE SHIELD | Attending: Emergency Medicine | Admitting: Emergency Medicine

## 2018-05-12 DIAGNOSIS — J069 Acute upper respiratory infection, unspecified: Secondary | ICD-10-CM | POA: Diagnosis not present

## 2018-05-12 DIAGNOSIS — J029 Acute pharyngitis, unspecified: Secondary | ICD-10-CM | POA: Insufficient documentation

## 2018-05-12 DIAGNOSIS — Z79899 Other long term (current) drug therapy: Secondary | ICD-10-CM | POA: Diagnosis not present

## 2018-05-12 DIAGNOSIS — R05 Cough: Secondary | ICD-10-CM | POA: Diagnosis present

## 2018-05-12 MED ORDER — ACETAMINOPHEN 325 MG PO TABS
650.0000 mg | ORAL_TABLET | Freq: Once | ORAL | Status: AC | PRN
  Administered 2018-05-12: 650 mg via ORAL
  Filled 2018-05-12: qty 2

## 2018-05-12 NOTE — ED Notes (Signed)
No asnwer when called for recheck on vital signs

## 2018-05-12 NOTE — ED Provider Notes (Signed)
[facility]-EMERGENCY DEPT Provider Note   CSN: [id number] Arrival date & time: 05/12/18  1539     History   Chief Complaint Chief Complaint  Patient presents with  . Headache  . Weakness    HPI Alejandra Scott is a 62 y.o. female.  Patient presents to the emergency department with a chief complaint of cough, sore throat, headache, and fever.  She states that the symptoms started on Saturday and worsens throughout the day on Sunday.  She has tried taking allergy medication with no relief.  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  She denies having taken anything additional for her symptoms.  The history is provided by the patient. No language interpreter was used.    No past medical history on file.  Patient Active Problem List   Diagnosis Date Noted  . FRACTURE, TOE 09/05/2009  . STRESS FRACTURE OF THE METATARSALS 03/29/2009  . METATARSALGIA 03/09/2009  . FOOT PAIN 03/09/2009    Past Surgical History:  Procedure Laterality Date  . PARTIAL HYSTERECTOMY       OB History   None      Home Medications    Prior to Admission medications   Medication Sig Start Date End Date Taking? Authorizing Provider  azithromycin (ZITHROMAX) 250 MG tablet Take 1 tablet (250 mg total) by mouth daily. Take first 2 tablets together, then 1 every day until finished. 03/03/16   Elson Areas, PA-C  benzonatate (TESSALON) 100 MG capsule Take 1 capsule (100 mg total) by mouth every 8 (eight) hours. 03/03/16   Elson Areas, PA-C  cyclobenzaprine (FLEXERIL) 10 MG tablet Take 1 tablet (10 mg total) by mouth 2 (two) times daily as needed for muscle spasms. 05/01/18   McDonald, Mia A, PA-C  Ginger Root POWD by Does not apply route.    [provider]  guaiFENesin (ROBITUSSIN) 100 MG/5ML liquid Take 200 mg by mouth 3 (three) times daily as needed for cough.    [provider]    HYDROcodone-homatropine (HYCODAN) 5-1.5 MG/5ML syrup Take 5 mLs by mouth every 8 (eight) hours as needed for cough. 03/13/16   Antony Madura, PA-C  ibuprofen (ADVIL,MOTRIN) 600 MG tablet Take 1 tablet (600 mg total) by mouth every 6 (six) hours as needed for fever, headache, mild pain or moderate pain. 03/13/16   Antony Madura, PA-C  OVER THE COUNTER MEDICATION Take 1 tablet by mouth 2 (two) times daily as needed (for pain). *QBC*    [provider]  oxyCODONE-acetaminophen (PERCOCET/ROXICET) 5-325 MG per tablet Take 2 tablets by mouth every 4 (four) hours as needed for severe pain. Patient not taking: Reported on 03/02/2016 07/25/15   Patel-Mills, Lorelle Formosa, PA-C  predniSONE (DELTASONE) 20 MG tablet Take 2 tablets (40 mg total) by mouth daily. 03/13/16   Antony Madura, PA-C  Turmeric POWD by Does not apply route.    [provider]    Family History Family History  Problem Relation Age of Onset  . Cancer Other   . Hypertension Other     Social History Social History   Tobacco Use  . Smoking status: Never Smoker  . Smokeless tobacco: Never Used  Substance Use Topics  . Alcohol use: Yes    Comment: occ  . Drug use: No     Allergies   Penicillins   Review of Systems Review of Systems  All other systems reviewed and are negative.  Physical Exam Updated Vital Signs BP 135/87 (BP Location: Left Arm)   Pulse 65   Temp 99.7 F (37.6 C) (Oral)   Resp 18   SpO2 100%   Physical Exam  Constitutional: She is oriented to person, place, and time. She appears well-developed and well-nourished.  HENT:  Head: Normocephalic and atraumatic.  Oropharynx is moderately erythematous, without exudate or abscess  Eyes: Pupils are equal, round, and reactive to light. Conjunctivae and EOM are normal.  Neck: Normal range of motion. Neck supple.  Normal range of motion of neck, no meningismus   Cardiovascular: Normal rate and regular rhythm. Exam reveals no gallop and no friction  rub.  No murmur heard. Pulmonary/Chest: Effort normal and breath sounds normal. No respiratory distress. She has no wheezes. She has no rales. She exhibits no tenderness.  CTAB  Abdominal: Soft. Bowel sounds are normal. She exhibits no distension and no mass. There is no tenderness. There is no rebound and no guarding.  Musculoskeletal: Normal range of motion. She exhibits no edema or tenderness.  Neurological: She is alert and oriented to person, place, and time.  Skin: Skin is warm and dry.  Psychiatric: She has a normal mood and affect. Her behavior is normal. Judgment and thought content normal.  Nursing note and vitals reviewed.    ED Treatments / Results  Labs (all labs ordered are listed, but only abnormal results are displayed) Labs Reviewed - No data to display  EKG None  Radiology No results found.  Procedures Procedures (including critical care time)  Medications Ordered in ED Medications  acetaminophen (TYLENOL) tablet 650 mg (650 mg Oral Given 05/12/18 1557)     Initial Impression / Assessment and Plan / ED Course  I have reviewed the triage vital signs and the nursing notes.  Pertinent labs & imaging results that were available during my care of the patient were reviewed by me and considered in my medical decision making (see chart for details).     Patient was sore throat, fever, and slight cough.  Onset was 2 days ago.  She is nontoxic-appearing.  She has no neck stiffness.  Doubt meningitis.  She is in no acute distress.  Will treat symptoms.  PCP follow-up.  Final Clinical Impressions(s) / ED Diagnoses   Final diagnoses:  URI with cough and congestion  Pharyngitis, unspecified etiology    ED Discharge Orders        Ordered    guaiFENesin-codeine 100-10 MG/5ML syrup  Every 4 hours PRN     05/13/18 0000    azithromycin (ZITHROMAX) 250 MG tablet  Daily     05/13/18 0000       Roxy Horseman, PA-C 05/13/18 0001    Devoria Albe, MD 05/13/18  [phone]

## 2018-05-12 NOTE — ED Triage Notes (Signed)
Pt complains of headache, weakness, cough, sneezing, sore throat x 2 days. Pt states she took some allergy medicine w/o relief.

## 2018-05-12 NOTE — ED Notes (Signed)
Bed: WA01 Expected date:  Expected time:  Means of arrival:  Comments: 

## 2018-05-13 ENCOUNTER — Other Ambulatory Visit: Payer: Self-pay

## 2018-05-13 MED ORDER — GUAIFENESIN-CODEINE 100-10 MG/5ML PO SOLN: 5.0000 mL | ORAL | 0 refills | Status: DC | PRN

## 2018-05-13 MED ORDER — AZITHROMYCIN 250 MG PO TABS: 250.0000 mg | ORAL_TABLET | Freq: Every day | ORAL | 0 refills | Status: DC

## 2019-05-04 ENCOUNTER — Other Ambulatory Visit: Payer: Self-pay | Admitting: Surgery

## 2020-05-05 ENCOUNTER — Emergency Department (HOSPITAL_COMMUNITY)
Admission: EM | Admit: 2020-05-05 | Discharge: 2020-05-05 | Disposition: A | Discharge status: Home / Self Care | Payer: Self-pay | Attending: Emergency Medicine | Admitting: Emergency Medicine

## 2020-05-05 ENCOUNTER — Encounter (HOSPITAL_COMMUNITY): Payer: Self-pay | Admitting: Emergency Medicine

## 2020-05-05 ENCOUNTER — Emergency Department (HOSPITAL_COMMUNITY): Payer: Self-pay

## 2020-05-05 DIAGNOSIS — R55 Syncope and collapse: Secondary | ICD-10-CM | POA: Insufficient documentation

## 2020-05-05 DIAGNOSIS — R001 Bradycardia, unspecified: Secondary | ICD-10-CM | POA: Insufficient documentation

## 2020-05-05 LAB — BASIC METABOLIC PANEL
Anion gap: 9 (ref 5–15)
BUN: 9 mg/dL (ref 8–23)
CO2: 28 mmol/L (ref 22–32)
Calcium: 8.8 mg/dL — ABNORMAL LOW (ref 8.9–10.3)
Chloride: 100 mmol/L (ref 98–111)
Creatinine, Ser: 0.74 mg/dL (ref 0.44–1.00)
GFR calc Af Amer: >60 mL/min (ref >60)
GFR calc non Af Amer: >60 mL/min (ref >60)
Glucose, Bld: 108 mg/dL — ABNORMAL HIGH (ref 70–99)
Potassium: 3.9 mmol/L (ref 3.5–5.1)
Sodium: 137 mmol/L (ref 135–145)

## 2020-05-05 LAB — URINALYSIS, ROUTINE W REFLEX MICROSCOPIC
Bilirubin Urine: NEGATIVE
Glucose, UA: NEGATIVE mg/dL
Hgb urine dipstick: NEGATIVE
Ketones, ur: NEGATIVE mg/dL
Leukocytes,Ua: NEGATIVE
Nitrite: NEGATIVE
Protein, ur: NEGATIVE mg/dL
Specific Gravity, Urine: 1.021 (ref 1.005–1.030)
pH: 6 (ref 5.0–8.0)

## 2020-05-05 LAB — CBC
HCT: 50.5 % — ABNORMAL HIGH (ref 36.0–46.0)
Hemoglobin: 16.6 g/dL — ABNORMAL HIGH (ref 12.0–15.0)
MCH: 30.7 pg (ref 26.0–34.0)
MCHC: 32.9 g/dL (ref 30.0–36.0)
MCV: 93.5 fL (ref 80.0–100.0)
Platelets: 172 10*3/uL (ref 150–400)
RBC: 5.4 MIL/uL — ABNORMAL HIGH (ref 3.87–5.11)
RDW: 13 % (ref 11.5–15.5)
WBC: 4 10*3/uL (ref 4.0–10.5)
nRBC: 0 % (ref 0.0–0.2)

## 2020-05-05 LAB — CBG MONITORING, ED: Glucose-Capillary: 95 mg/dL (ref 70–99)

## 2020-05-05 MED ORDER — SODIUM CHLORIDE 0.9 % IV BOLUS
1000.0000 mL | Freq: Once | INTRAVENOUS | Status: AC
  Administered 2020-05-05: 1000 mL via INTRAVENOUS

## 2020-05-05 NOTE — ED Notes (Signed)
Discharge paperwork reviewed with pt.  Pt with no questions or concerns at this time. Pt on phone arranging for transportation while RN in room.

## 2020-05-05 NOTE — ED Triage Notes (Signed)
Per EMS-states, feeling fatigued for a couple of days-was talking to daughter on phone and had a syncopal episode-CBG 147-history of low HR-4 mg of Zofran given in route-nausea improved

## 2020-05-05 NOTE — ED Notes (Signed)
Pt ambulatory to bathroom, standby assistance.  

## 2020-05-05 NOTE — ED Provider Notes (Signed)
[facility]-EMERGENCY DEPT Provider Note   CSN: [id number] Arrival date & time: 05/05/20  2703     History Chief Complaint  Patient presents with  . Loss of Consciousness    Alejandra Scott is a 64 y.o. female.  Patient with history of bradycardia presents to the emergency department today after having a syncopal episode prior to arrival.  Patient reports having nasal congestion and fatigue over the past 2 days which she attributes to seasonal allergies.  She has been taking over-the-counter sinus medication for this.  She states that she went to have a coronavirus test performed.  This morning she was talking to her daughter on the phone when she suddenly became very hot and nauseous.  She was sitting in a chair and passed out but did not fall.  She states that she did not hear her daughter trying to talk to her on the phone.  She then came to.  She denies any associated chest pain or shortness of breath.  She reports decreased oral intake due to feeling poorly over the past several days and likely has not been drinking enough fluids as well.  She denies any vomiting or diarrhea.  She has a history of allergies but denies any hives, facial or lip swelling or food exposures.  No history of hypertension, diabetes, high cholesterol, or known heart disease.  She is not on any beta-blockers.  No current antibiotics.  Review of previous chart shows that her heart rate over the past few years typically ranges 55-70.        History reviewed. No pertinent past medical history.  Patient Active Problem List   Diagnosis Date Noted  . FRACTURE, TOE 09/05/2009  . STRESS FRACTURE OF THE METATARSALS 03/29/2009  . METATARSALGIA 03/09/2009  . FOOT PAIN 03/09/2009    Past Surgical History:  Procedure Laterality Date  . PARTIAL HYSTERECTOMY       OB History   No obstetric history on file.     Family History  Problem Relation Age of Onset  . Cancer Other   .  Hypertension Other     Social History   Tobacco Use  . Smoking status: Never Smoker  . Smokeless tobacco: Never Used  Substance Use Topics  . Alcohol use: Yes    Comment: occ  . Drug use: No    Home Medications Prior to Admission medications   Medication Sig Start Date End Date Taking? Authorizing Provider  azithromycin (ZITHROMAX) 250 MG tablet Take 1 tablet (250 mg total) by mouth daily. Take first 2 tablets together, then 1 every day until finished. 05/12/18   Roxy Horseman, PA-C  cyclobenzaprine (FLEXERIL) 10 MG tablet Take 1 tablet (10 mg total) by mouth 2 (two) times daily as needed for muscle spasms. Patient not taking: Reported on 05/13/2018 05/01/18   McDonald, Mia A, PA-C  guaiFENesin-codeine 100-10 MG/5ML syrup Take 5 mLs by mouth every 4 (four) hours as needed for cough. 05/12/18   Roxy Horseman, PA-C    Allergies    Bee venom and Penicillins  Review of Systems   Review of Systems  Constitutional: Negative for diaphoresis and fever.  HENT: Positive for congestion.   Eyes: Negative for redness.  Respiratory: Negative for cough and shortness of breath.   Cardiovascular: Negative for chest pain, palpitations and leg swelling.  Gastrointestinal: Negative for abdominal pain, blood in stool, diarrhea, nausea and vomiting.  Genitourinary: Negative for dysuria.  Musculoskeletal: Negative for back pain and neck pain.  Skin: Negative for rash.  Neurological: Positive for syncope and light-headedness. Negative for headaches.  Psychiatric/Behavioral: The patient is not nervous/anxious.     Physical Exam Updated Vital Signs BP 122/77 (BP Location: Left Arm)   Pulse (!) 50   Temp 97.6 F (36.4 C) (Oral)   Resp 18   SpO2 100%   Physical Exam Vitals and nursing note reviewed.  Constitutional:      Appearance: She is well-developed. She is not diaphoretic.  HENT:     Head: Normocephalic and atraumatic.     Mouth/Throat:     Mouth: Mucous membranes are not dry.    Eyes:     Conjunctiva/sclera: Conjunctivae normal.  Neck:     Vascular: Normal carotid pulses. No carotid bruit or JVD.     Trachea: Trachea normal. No tracheal deviation.  Cardiovascular:     Rate and Rhythm: Regular rhythm. Bradycardia present.     Pulses: No decreased pulses.     Heart sounds: Normal heart sounds, S1 normal and S2 normal. No murmur.  Pulmonary:     Effort: Pulmonary effort is normal. No respiratory distress.     Breath sounds: No wheezing.  Chest:     Chest wall: No tenderness.  Abdominal:     General: Bowel sounds are normal.     Palpations: Abdomen is soft.     Tenderness: There is no abdominal tenderness. There is no guarding or rebound.  Musculoskeletal:        General: Normal range of motion.     Cervical back: Normal range of motion and neck supple. No muscular tenderness.  Skin:    General: Skin is warm and dry.     Coloration: Skin is not pale.  Neurological:     Mental Status: She is alert.     ED Results / Procedures / Treatments   Labs (all labs ordered are listed, but only abnormal results are displayed) Labs Reviewed  BASIC METABOLIC PANEL - Abnormal; Notable for the following components:      Result Value   Glucose, Bld 108 (*)    Calcium 8.8 (*)    All other components within normal limits  CBC - Abnormal; Notable for the following components:   RBC 5.40 (*)    Hemoglobin 16.6 (*)    HCT 50.5 (*)    All other components within normal limits  URINALYSIS, ROUTINE W REFLEX MICROSCOPIC - Abnormal; Notable for the following components:   Color, Urine AMBER (*)    All other components within normal limits  CBG MONITORING, ED    EKG EKG Interpretation  Date/Time:  Thursday May 05 2020 08:48:27 EDT Ventricular Rate:  47 PR Interval:    QRS Duration: 89 QT Interval:  448 QTC Calculation: 397 R Axis:   29 Text Interpretation: Sinus bradycardia Anteroseptal infarct, age indeterminate 80 Lead; Mason-Likar No STEMI Confirmed by Nanda Quinton [phone]) on 05/05/2020 9:10:11 AM   Radiology DG Chest 2 View  Result Date: 05/05/2020 CLINICAL DATA:  Cough and syncope EXAM: CHEST - 2 VIEW COMPARISON:  03/13/2016 FINDINGS: The heart size and mediastinal contours are within normal limits. Both lungs are clear. The visualized skeletal structures are unremarkable. IMPRESSION: No active cardiopulmonary disease. Electronically Signed   By: Franchot Gallo M.D.   On: 05/05/2020 10:46    Procedures Procedures (including critical care time)  Medications Ordered in ED Medications  sodium chloride 0.9 % bolus 1,000 mL (1,000 mLs Intravenous New Bag/Given 05/05/20 1024)    ED  Course  I have reviewed the triage vital signs and the nursing notes.  Pertinent labs & imaging results that were available during my care of the patient were reviewed by me and considered in my medical decision making (see chart for details).  9:50 AM Pt in restroom. EKG reviewed, sinus bradycardia.   Patient seen and examined. Work-up reviewed.  Hgb elevated, ?hemoconcentration.    Vital signs reviewed and are as follows: BP 122/69   Pulse 71   Temp 97.6 F (36.4 C) (Oral)   Resp 15   SpO2 100%   10:22 AM Labs completed, reviewed. Awaiting CXR.   12:10 PM labs reassuring.  Chest x-ray is clear.  Patient discussed with and seen by Dr. Criss Alvine.  Patient doing well.  Plan for discharged home with rest, hydration.  Encourage PCP follow-up for recheck in the next 3 days.  Encouraged return to the emergency department if she develops any chest pain, shortness of breath, additional episodes of passing out or other concerns.  Patient verbalized understanding agrees with plan.   MDM Rules/Calculators/A&P                      Syncope with prodrome. EKG without ischemia and patient with no CP/SOB. Neg orthostatics. Suspect some degree of dehydration --hydrated with IV fluids.  No anemia.  Chest x-ray clear.  Patient does have bradycardia, history of same.  This  may be contributing, however doubt underlying cause.  Patient is somewhat bradycardic at baseline.  Do not feel that emergent cardiology evaluation is indicated at this time.  Final Clinical Impression(s) / ED Diagnoses Final diagnoses:  Syncope, unspecified syncope type  Bradycardia    Rx / DC Orders ED Discharge Orders    None       Renne Crigler, PA-C 05/05/20 1212    Pricilla Loveless, MD 05/05/20 1512

## 2020-05-05 NOTE — Discharge Instructions (Signed)
Please read and follow all provided instructions.  Your diagnoses today include:  1. Syncope, unspecified syncope type     Tests performed today include:    Vital signs. See below for your results today.   Medications prescribed:   Take any prescribed medications only as directed.  Home care instructions:  Follow any educational materials contained in this packet.  BE VERY CAREFUL not to take multiple medicines containing Tylenol (also called acetaminophen). Doing so can lead to an overdose which can damage your liver and cause liver failure and possibly death.   Follow-up instructions: Please follow-up with your primary care provider in the next 3 days for further evaluation of your symptoms.   Return instructions:   Please return to the Emergency Department if you experience worsening symptoms.   Please return if you have any other emergent concerns.  Additional Information:  Your vital signs today were: BP (!) 119/92   Pulse (!) 52   Temp 97.6 F (36.4 C) (Oral)   Resp 15   SpO2 100%  If your blood pressure (BP) was elevated above 135/85 this visit, please have this repeated by your doctor within one month. --------------

## 2020-05-11 ENCOUNTER — Other Ambulatory Visit: Payer: Self-pay

## 2020-05-11 ENCOUNTER — Emergency Department (HOSPITAL_COMMUNITY): Payer: Self-pay

## 2020-05-11 ENCOUNTER — Encounter (HOSPITAL_COMMUNITY): Payer: Self-pay | Admitting: Emergency Medicine

## 2020-05-11 ENCOUNTER — Emergency Department (HOSPITAL_COMMUNITY)
Admission: EM | Admit: 2020-05-11 | Discharge: 2020-05-11 | Disposition: A | Discharge status: Home / Self Care | Payer: Self-pay | Attending: Emergency Medicine | Admitting: Emergency Medicine

## 2020-05-11 DIAGNOSIS — J1282 Pneumonia due to coronavirus disease 2019: Secondary | ICD-10-CM | POA: Insufficient documentation

## 2020-05-11 DIAGNOSIS — J189 Pneumonia, unspecified organism: Secondary | ICD-10-CM

## 2020-05-11 DIAGNOSIS — U071 COVID-19: Secondary | ICD-10-CM | POA: Insufficient documentation

## 2020-05-11 DIAGNOSIS — Z79899 Other long term (current) drug therapy: Secondary | ICD-10-CM | POA: Insufficient documentation

## 2020-05-11 LAB — CBC WITH DIFFERENTIAL/PLATELET
Abs Immature Granulocytes: 0.02 10*3/uL (ref 0.00–0.07)
Basophils Absolute: 0 10*3/uL (ref 0.0–0.1)
Basophils Relative: 0 %
Eosinophils Absolute: 0 10*3/uL (ref 0.0–0.5)
Eosinophils Relative: 0 %
HCT: 47.6 % — ABNORMAL HIGH (ref 36.0–46.0)
Hemoglobin: 15.8 g/dL — ABNORMAL HIGH (ref 12.0–15.0)
Immature Granulocytes: 1 %
Lymphocytes Relative: 24 %
Lymphs Abs: 0.7 10*3/uL (ref 0.7–4.0)
MCH: 30.3 pg (ref 26.0–34.0)
MCHC: 33.2 g/dL (ref 30.0–36.0)
MCV: 91.2 fL (ref 80.0–100.0)
Monocytes Absolute: 0.2 10*3/uL (ref 0.1–1.0)
Monocytes Relative: 6 %
Neutro Abs: 1.9 10*3/uL (ref 1.7–7.7)
Neutrophils Relative %: 69 %
Platelets: 155 10*3/uL (ref 150–400)
RBC: 5.22 MIL/uL — ABNORMAL HIGH (ref 3.87–5.11)
RDW: 12.1 % (ref 11.5–15.5)
WBC: 2.8 10*3/uL — ABNORMAL LOW (ref 4.0–10.5)
nRBC: 0 % (ref 0.0–0.2)

## 2020-05-11 LAB — COMPREHENSIVE METABOLIC PANEL
ALT: 13 U/L (ref 0–44)
AST: 27 U/L (ref 15–41)
Albumin: 4.5 g/dL (ref 3.5–5.0)
Alkaline Phosphatase: 56 U/L (ref 38–126)
Anion gap: 10 (ref 5–15)
BUN: 11 mg/dL (ref 8–23)
CO2: 28 mmol/L (ref 22–32)
Calcium: 9 mg/dL (ref 8.9–10.3)
Chloride: 100 mmol/L (ref 98–111)
Creatinine, Ser: 0.87 mg/dL (ref 0.44–1.00)
GFR calc Af Amer: >60 mL/min (ref >60)
GFR calc non Af Amer: >60 mL/min (ref >60)
Glucose, Bld: 104 mg/dL — ABNORMAL HIGH (ref 70–99)
Potassium: 3.7 mmol/L (ref 3.5–5.1)
Sodium: 138 mmol/L (ref 135–145)
Total Bilirubin: 0.8 mg/dL (ref 0.3–1.2)
Total Protein: 7.7 g/dL (ref 6.5–8.1)

## 2020-05-11 MED ORDER — ALBUTEROL SULFATE HFA 108 (90 BASE) MCG/ACT IN AERS
2.0000 | INHALATION_SPRAY | RESPIRATORY_TRACT | Status: DC | PRN
  Administered 2020-05-11: 2 via RESPIRATORY_TRACT
  Filled 2020-05-11: qty 6.7

## 2020-05-11 MED ORDER — DOXYCYCLINE HYCLATE 100 MG PO CAPS: 100.0000 mg | ORAL_CAPSULE | Freq: Two times a day (BID) | ORAL | 0 refills | Status: DC

## 2020-05-11 NOTE — ED Provider Notes (Signed)
[facility] DEPT Provider Note   CSN: [id number] Arrival date & time: 05/11/20  0846     History Chief Complaint  Patient presents with  . Cough  . COVID +    Alejandra Scott is a 64 y.o. female.  Patient complains of cough fever weakness.  She was diagnosed with Covid 19 last week.  Patient does not take any medicines for any medical problems  The history is provided by the patient. No language interpreter was used.  Cough Cough characteristics:  Non-productive Sputum characteristics:  Nondescript Severity:  Moderate Onset quality:  Sudden Timing:  Constant Progression:  Worsening Chronicity:  New Smoker: no   Context: not animal exposure   Relieved by:  Nothing Worsened by:  Nothing Associated symptoms: shortness of breath   Associated symptoms: no chest pain, no eye discharge, no headaches and no rash        History reviewed. No pertinent past medical history.  Patient Active Problem List   Diagnosis Date Noted  . FRACTURE, TOE 09/05/2009  . STRESS FRACTURE OF THE METATARSALS 03/29/2009  . METATARSALGIA 03/09/2009  . FOOT PAIN 03/09/2009    Past Surgical History:  Procedure Laterality Date  . PARTIAL HYSTERECTOMY       OB History   No obstetric history on file.     Family History  Problem Relation Age of Onset  . Cancer Other   . Hypertension Other     Social History   Tobacco Use  . Smoking status: Never Smoker  . Smokeless tobacco: Never Used  Substance Use Topics  . Alcohol use: Yes    Comment: occ  . Drug use: No    Home Medications Prior to Admission medications   Medication Sig Start Date End Date Taking? Authorizing Provider  doxycycline (VIBRAMYCIN) 100 MG capsule Take 1 capsule (100 mg total) by mouth 2 (two) times daily. One po bid x 7 days 05/11/20   Milton Ferguson, MD  OVER THE COUNTER MEDICATION Take 1 tablet by mouth daily as needed (allergies). Herbal allergy supplement    [provider]    Allergies    Bee venom and Penicillins  Review of Systems   Review of Systems  Constitutional: Negative for appetite change and fatigue.  HENT: Negative for congestion, ear discharge and sinus pressure.   Eyes: Negative for discharge.  Respiratory: Positive for cough and shortness of breath.   Cardiovascular: Negative for chest pain.  Gastrointestinal: Negative for abdominal pain and diarrhea.  Genitourinary: Negative for frequency and hematuria.  Musculoskeletal: Negative for back pain.  Skin: Negative for rash.  Neurological: Negative for seizures and headaches.  Psychiatric/Behavioral: Negative for hallucinations.    Physical Exam Updated Vital Signs BP 116/77   Pulse 63   Temp 98.2 F (36.8 C) (Oral)   Resp 15   SpO2 98%   Physical Exam Vitals reviewed.  Constitutional:      Appearance: She is well-developed.  HENT:     Head: Normocephalic.     Nose: Nose normal.  Eyes:     General: No scleral icterus.    Conjunctiva/sclera: Conjunctivae normal.  Neck:     Thyroid: No thyromegaly.  Cardiovascular:     Rate and Rhythm: Normal rate and regular rhythm.     Heart sounds: No murmur. No friction rub. No gallop.   Pulmonary:     Breath sounds: No stridor. No wheezing or rales.  Chest:     Chest wall: No tenderness.  Abdominal:     General: There is no distension.     Tenderness: There is no abdominal tenderness. There is no rebound.  Musculoskeletal:        General: Normal range of motion.     Cervical back: Neck supple.  Lymphadenopathy:     Cervical: No cervical adenopathy.  Skin:    Findings: No erythema or rash.  Neurological:     Mental Status: She is alert and oriented to person, place, and time.     Motor: No abnormal muscle tone.     Coordination: Coordination normal.  Psychiatric:        Behavior: Behavior normal.     ED Results / Procedures / Treatments   Labs (all labs ordered are listed, but only abnormal results are  displayed) Labs Reviewed  CBC WITH DIFFERENTIAL/PLATELET - Abnormal; Notable for the following components:      Result Value   WBC 2.8 (*)    RBC 5.22 (*)    Hemoglobin 15.8 (*)    HCT 47.6 (*)    All other components within normal limits  COMPREHENSIVE METABOLIC PANEL - Abnormal; Notable for the following components:   Glucose, Bld 104 (*)    All other components within normal limits    EKG None  Radiology DG Chest Port 1 View  Result Date: 05/11/2020 CLINICAL DATA:  Cough. Diagnosed with COVID-19 last week. EXAM: PORTABLE CHEST 1 VIEW COMPARISON:  05/05/2020 FINDINGS: Heart size normal. No pleural effusion or edema. New subtle airspace opacity within the periphery of the left midlung and right lower lobe noted. Visualized osseous structures appear intact IMPRESSION: New subtle airspace opacities in the periphery of the left midlung and right lower lobe compatible with pneumonia. Electronically Signed   By: Signa Kell M.D.   On: 05/11/2020 09:34    Procedures Procedures (including critical care time)  Medications Ordered in ED Medications  albuterol (VENTOLIN HFA) 108 (90 Base) MCG/ACT inhaler 2 puff (2 puffs Inhalation Given 05/11/20 3545)    ED Course  I have reviewed the triage vital signs and the nursing notes.  Pertinent labs & imaging results that were available during my care of the patient were reviewed by me and considered in my medical decision making (see chart for details).    MDM Rules/Calculators/A&P                      Patient with COVID-19 and pneumonia on chest x-ray.  Patient had some shortness of breath and that has been helped with albuterol.  She will be discharged home with the albuterol and doxycycline and will follow up with PCP or return if problems      This patient presents to the ED for concern of cough, this involves an extensive number of treatment options, and is a complaint that carries with it a high risk of complications and  morbidity.  The differential diagnosis includes pneumonia Covid   Lab Tests:   I Ordered, reviewed, and interpreted labs, which included CBC chemistries which showed some leukopenia  Medicines ordered:   I ordered medication albuterol for shortness of breath  Imaging Studies ordered:   I ordered imaging studies which included chest x-ray and  I independently visualized and interpreted imaging which showed pneumonia  Additional history obtained:   Additional history obtained from records  Previous records obtained and reviewed  Consultations Obtained:   Reevaluation:  After the interventions stated above, I reevaluated the patient and found mild improvement  Critical Interventions:  .   Final Clinical Impression(s) / ED Diagnoses Final diagnoses:  COVID-19  Community acquired pneumonia, unspecified laterality    Rx / DC Orders ED Discharge Orders         Ordered    doxycycline (VIBRAMYCIN) 100 MG capsule  2 times daily     05/11/20 1109           Bethann Berkshire, MD 05/11/20 1114

## 2020-05-11 NOTE — Discharge Instructions (Addendum)
Follow-up in the next week if getting worse or not improving.

## 2020-05-11 NOTE — ED Triage Notes (Signed)
Patient here from home reporting increased cough. Diagnosed COVID + last Thursday.

## 2020-05-14 ENCOUNTER — Emergency Department (HOSPITAL_COMMUNITY)
Admission: EM | Admit: 2020-05-14 | Discharge: 2020-05-14 | Disposition: A | Discharge status: Home / Self Care | Payer: Self-pay | Attending: Emergency Medicine | Admitting: Emergency Medicine

## 2020-05-14 ENCOUNTER — Emergency Department (HOSPITAL_COMMUNITY): Payer: Self-pay

## 2020-05-14 ENCOUNTER — Encounter (HOSPITAL_COMMUNITY): Payer: Self-pay

## 2020-05-14 ENCOUNTER — Other Ambulatory Visit: Payer: Self-pay

## 2020-05-14 DIAGNOSIS — U071 COVID-19: Secondary | ICD-10-CM | POA: Insufficient documentation

## 2020-05-14 DIAGNOSIS — R531 Weakness: Secondary | ICD-10-CM

## 2020-05-14 LAB — URINALYSIS, ROUTINE W REFLEX MICROSCOPIC
Bacteria, UA: NONE SEEN
Bilirubin Urine: NEGATIVE
Glucose, UA: NEGATIVE mg/dL
Ketones, ur: NEGATIVE mg/dL
Leukocytes,Ua: NEGATIVE
Nitrite: NEGATIVE
Protein, ur: NEGATIVE mg/dL
Specific Gravity, Urine: 1.004 — ABNORMAL LOW (ref 1.005–1.030)
pH: 7 (ref 5.0–8.0)

## 2020-05-14 LAB — CBC WITH DIFFERENTIAL/PLATELET
Abs Immature Granulocytes: 0.02 10*3/uL (ref 0.00–0.07)
Basophils Absolute: 0 10*3/uL (ref 0.0–0.1)
Basophils Relative: 0 %
Eosinophils Absolute: 0 10*3/uL (ref 0.0–0.5)
Eosinophils Relative: 0 %
HCT: 46.8 % — ABNORMAL HIGH (ref 36.0–46.0)
Hemoglobin: 15.7 g/dL — ABNORMAL HIGH (ref 12.0–15.0)
Immature Granulocytes: 0 %
Lymphocytes Relative: 14 %
Lymphs Abs: 0.7 10*3/uL (ref 0.7–4.0)
MCH: 30.5 pg (ref 26.0–34.0)
MCHC: 33.5 g/dL (ref 30.0–36.0)
MCV: 90.9 fL (ref 80.0–100.0)
Monocytes Absolute: 0.2 10*3/uL (ref 0.1–1.0)
Monocytes Relative: 4 %
Neutro Abs: 3.8 10*3/uL (ref 1.7–7.7)
Neutrophils Relative %: 82 %
Platelets: 177 10*3/uL (ref 150–400)
RBC: 5.15 MIL/uL — ABNORMAL HIGH (ref 3.87–5.11)
RDW: 12.2 % (ref 11.5–15.5)
WBC: 4.7 10*3/uL (ref 4.0–10.5)
nRBC: 0 % (ref 0.0–0.2)

## 2020-05-14 LAB — BASIC METABOLIC PANEL
Anion gap: 11 (ref 5–15)
BUN: 9 mg/dL (ref 8–23)
CO2: 27 mmol/L (ref 22–32)
Calcium: 9.3 mg/dL (ref 8.9–10.3)
Chloride: 99 mmol/L (ref 98–111)
Creatinine, Ser: 0.75 mg/dL (ref 0.44–1.00)
GFR calc Af Amer: >60 mL/min (ref >60)
GFR calc non Af Amer: >60 mL/min (ref >60)
Glucose, Bld: 112 mg/dL — ABNORMAL HIGH (ref 70–99)
Potassium: 4 mmol/L (ref 3.5–5.1)
Sodium: 137 mmol/L (ref 135–145)

## 2020-05-14 LAB — MAGNESIUM: Magnesium: 2 mg/dL (ref 1.7–2.4)

## 2020-05-14 MED ORDER — SODIUM CHLORIDE 0.9 % IV BOLUS
1000.0000 mL | Freq: Once | INTRAVENOUS | Status: AC
  Administered 2020-05-14: 1000 mL via INTRAVENOUS

## 2020-05-14 NOTE — ED Triage Notes (Signed)
64 yo female c/o generalized weakness for the past 3 weeks. Pt was dx'd with Covid 3 weeks ago and completed quarantine. Dx'd with PNA 3 days ago and started Doxycycline. Sts unable to eat or drink. Denies n/v/d.

## 2020-05-14 NOTE — Discharge Instructions (Addendum)
You are seen in the emergency department for generalized fatigue in the setting of a recent Covid infection and pneumonia. Lab work chest x-ray EKG that did not show any acute findings.  Please try to increase your fluid and calorie intake.  Follow-up with your doctor.  Return to the emergency department if any worsening or concerning symptoms.

## 2020-05-14 NOTE — ED Notes (Addendum)
Urine culture sent down to lab with urinalysis. 

## 2020-05-14 NOTE — ED Provider Notes (Signed)
[facility] DEPT Provider Note   CSN: [id number] Arrival date & time: 05/14/20  0707     History No chief complaint on file.   Alejandra Scott is a 64 y.o. female.  She has no significant past medical history.  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  She was here 3 days ago with worsening cough and was told she had pneumonia.  Given an inhaler and doxy.  She continues to have a nonproductive cough and feeling generalized weakness.  She said she is very thirsty but has no appetite.  No known fever.  The history is provided by the patient.  Weakness Severity:  Severe Onset quality:  Gradual Duration:  2 weeks Timing:  Constant Progression:  Worsening Chronicity:  New Context: dehydration and recent infection   Relieved by:  Nothing Worsened by:  Activity Ineffective treatments:  Drinking fluids Associated symptoms: cough, dizziness and shortness of breath   Associated symptoms: no abdominal pain, no chest pain, no diarrhea, no dysuria, no falls, no fever, no loss of consciousness, no nausea and no vomiting        No past medical history on file.  Patient Active Problem List   Diagnosis Date Noted  . FRACTURE, TOE 09/05/2009  . STRESS FRACTURE OF THE METATARSALS 03/29/2009  . METATARSALGIA 03/09/2009  . FOOT PAIN 03/09/2009    Past Surgical History:  Procedure Laterality Date  . PARTIAL HYSTERECTOMY       OB History   No obstetric history on file.     Family History  Problem Relation Age of Onset  . Cancer Other   . Hypertension Other     Social History   Tobacco Use  . Smoking status: Never Smoker  . Smokeless tobacco: Never Used  Substance Use Topics  . Alcohol use: Yes    Comment: occ  . Drug use: No    Home Medications Prior to Admission medications   Medication Sig Start Date End Date Taking? Authorizing Provider  doxycycline (VIBRAMYCIN) 100 MG capsule Take 1 capsule (100 mg total)  by mouth 2 (two) times daily. One po bid x 7 days 05/11/20   Milton Ferguson, MD  OVER THE COUNTER MEDICATION Take 1 tablet by mouth daily as needed (allergies). Herbal allergy supplement    [provider]    Allergies    Bee venom and Penicillins  Review of Systems   Review of Systems  Constitutional: Positive for fatigue. Negative for fever.  HENT: Negative for sore throat.   Eyes: Negative for visual disturbance.  Respiratory: Positive for cough and shortness of breath.   Cardiovascular: Negative for chest pain.  Gastrointestinal: Negative for abdominal pain, diarrhea, nausea and vomiting.  Genitourinary: Negative for dysuria.  Musculoskeletal: Negative for back pain and falls.  Skin: Negative for rash.  Neurological: Positive for dizziness and weakness. Negative for loss of consciousness.    Physical Exam Updated Vital Signs BP 112/75   Pulse 74   Temp 99.8 F (37.7 C) (Oral)   Resp 18   Ht 5\' 4"  (1.626 m)   Wt 59 kg   SpO2 96%   BMI 22.31 kg/m   Physical Exam Vitals and nursing note reviewed.  Constitutional:      General: She is not in acute distress.    Appearance: Normal appearance. She is well-developed.  HENT:     Head: Normocephalic and atraumatic.  Eyes:     Conjunctiva/sclera: Conjunctivae normal.  Cardiovascular:  Rate and Rhythm: Normal rate and regular rhythm.     Heart sounds: No murmur.  Pulmonary:     Effort: Pulmonary effort is normal. No respiratory distress.     Breath sounds: Normal breath sounds.  Abdominal:     Palpations: Abdomen is soft.     Tenderness: There is no abdominal tenderness.  Musculoskeletal:        General: No deformity or signs of injury. Normal range of motion.     Cervical back: Neck supple.  Skin:    General: Skin is warm and dry.     Capillary Refill: Capillary refill takes less than 2 seconds.  Neurological:     General: No focal deficit present.     Mental Status: She is alert.     Sensory: No  sensory deficit.     Motor: No weakness.     ED Results / Procedures / Treatments   Labs (all labs ordered are listed, but only abnormal results are displayed) Labs Reviewed  BASIC METABOLIC PANEL - Abnormal; Notable for the following components:      Result Value   Glucose, Bld 112 (*)    All other components within normal limits  CBC WITH DIFFERENTIAL/PLATELET - Abnormal; Notable for the following components:   RBC 5.15 (*)    Hemoglobin 15.7 (*)    HCT 46.8 (*)    All other components within normal limits  URINALYSIS, ROUTINE W REFLEX MICROSCOPIC - Abnormal; Notable for the following components:   Specific Gravity, Urine 1.004 (*)    Hgb urine dipstick SMALL (*)    All other components within normal limits  MAGNESIUM    EKG EKG Interpretation  Date/Time:  Saturday May 14 2020 08:10:07 EDT Ventricular Rate:  66 PR Interval:    QRS Duration: 133 QT Interval:  471 QTC Calculation: 494 R Axis:   -39 Text Interpretation: Sinus rhythm Atrial premature complex IVCD, consider atypical RBBB Abnormal T, consider ischemia, lateral leads new from prior but seeen on 3/16 Confirmed by Meridee Score [phone]) on 05/14/2020 8:14:41 AM   Radiology DG Chest Port 1 View  Result Date: 05/14/2020 CLINICAL DATA:  Cough and fatigue EXAM: PORTABLE CHEST 1 VIEW COMPARISON:  05/11/2020 FINDINGS: Cardiac shadow is within normal limits. The lungs are well aerated bilaterally. Minimal patchy opacities are seen bilaterally stable from the prior exam. No bony abnormality is noted. IMPRESSION: Stable patchy opacities bilaterally. Electronically Signed   By: Alcide Clever M.D.   On: 05/14/2020 08:20    Procedures Procedures (including critical care time)  Medications Ordered in ED Medications  sodium chloride 0.9 % bolus 1,000 mL (0 mLs Intravenous Stopped 05/14/20 1123)    ED Course  I have reviewed the triage vital signs and the nursing notes.  Pertinent labs & imaging results that were  available during my care of the patient were reviewed by me and considered in my medical decision making (see chart for details).  Clinical Course as of May 14 1746  Sat May 14, 2020  1201 Viewed her work-up with her and her son.  Do not see any obvious indication for admission.  They understand she needs to push fluids and try to get some food in her.  Return instructions discussed.   [MB]    Clinical Course User Index [MB] Terrilee Files, MD   MDM Rules/Calculators/A&P                     This patient complains of  generalized weakness cough decreased appetite; this involves an extensive number of treatment Options and is a complaint that carries with it a high risk of complications and Morbidity. The differential includes continued Covid symptoms, anemia, metabolic derangement, anemia  I ordered, reviewed and interpreted labs, which included normal white count, stable hemoglobin, stable chemistry, urinalysis unremarkable I ordered medication IV fluids I ordered imaging studies which included chest x-ray and I independently    visualized and interpreted imaging which showed stable from prior Additional history obtained from patient's son Previous records obtained and reviewed including ED visit from a few days ago After the interventions stated above, I reevaluated the patient and found patient to be hemodynamic stable.  Not requiring oxygen.  Do not feel she needs admission for current symptoms.  I did hydration and PCP follow-up.  Return instructions discussed.  Alejandra Scott was evaluated in Emergency Department on 05/14/2020 for the symptoms described in the history of present illness. She was evaluated in the context of the global COVID-19 pandemic, which necessitated consideration that the patient might be at risk for infection with the SARS-CoV-2 virus that causes COVID-19. Institutional protocols and algorithms that pertain to the evaluation of patients at risk for COVID-19 are  in a state of rapid change based on information released by regulatory bodies including the CDC and federal and state organizations. These policies and algorithms were followed during the patient's care in the ED.  Final Clinical Impression(s) / ED Diagnoses Final diagnoses:  Generalized weakness    Rx / DC Orders ED Discharge Orders    None       Terrilee Files, MD 05/14/20 1749

## 2020-10-20 ENCOUNTER — Emergency Department (HOSPITAL_COMMUNITY): Payer: 59

## 2020-10-20 ENCOUNTER — Other Ambulatory Visit: Payer: Self-pay

## 2020-10-20 ENCOUNTER — Emergency Department (HOSPITAL_COMMUNITY)
Admission: EM | Admit: 2020-10-20 | Discharge: 2020-10-20 | Disposition: A | Discharge status: Home / Self Care | Payer: 59 | Attending: Emergency Medicine | Admitting: Emergency Medicine

## 2020-10-20 ENCOUNTER — Encounter (HOSPITAL_COMMUNITY): Payer: Self-pay

## 2020-10-20 DIAGNOSIS — Y9389 Activity, other specified: Secondary | ICD-10-CM | POA: Insufficient documentation

## 2020-10-20 DIAGNOSIS — W01198A Fall on same level from slipping, tripping and stumbling with subsequent striking against other object, initial encounter: Secondary | ICD-10-CM | POA: Diagnosis not present

## 2020-10-20 DIAGNOSIS — S6392XA Sprain of unspecified part of left wrist and hand, initial encounter: Secondary | ICD-10-CM | POA: Diagnosis not present

## 2020-10-20 DIAGNOSIS — S6992XA Unspecified injury of left wrist, hand and finger(s), initial encounter: Secondary | ICD-10-CM | POA: Diagnosis present

## 2020-10-20 MED ORDER — ACETAMINOPHEN 325 MG PO TABS
650.0000 mg | ORAL_TABLET | Freq: Once | ORAL | Status: AC
  Administered 2020-10-20: 650 mg via ORAL
  Filled 2020-10-20: qty 2

## 2020-10-20 MED ORDER — IBUPROFEN 800 MG PO TABS
800.0000 mg | ORAL_TABLET | Freq: Once | ORAL | Status: AC
  Administered 2020-10-20: 800 mg via ORAL
  Filled 2020-10-20: qty 1

## 2020-10-20 NOTE — Progress Notes (Signed)
Orthopedic Tech Progress Note Patient Details:  Alejandra Scott Feb 27, 1956 [id number]  Ortho Devices Ortho Device/Splint Location: applied thumb spica LUE Ortho Device/Splint Interventions: Ordered, Application   Post Interventions Patient Tolerated: Well Instructions Provided: Care of device   Jennye Moccasin 10/20/2020, 11:12 AM

## 2020-10-20 NOTE — ED Provider Notes (Signed)
[facility]EMERGENCY DEPT Provider Note   CSN: [id number] Arrival date & time: 10/20/20  0350     History Chief Complaint  Patient presents with  . Wrist Pain    Alejandra Scott is a 64 y.o. female.  HPI Patient is a 64 year old female with a medical history as noted below.  Patient states she was exercising this morning and tripped on a rug causing her to fall on her left side landing on her left wrist and hand.  She reports moderate pain to the left wrist and base of the left thumb.  Pain worsens with palpation and movement.  Has not taken anything for her symptoms.  She denies any head trauma or LOC.  No numbness, tingling, weakness.    History reviewed. No pertinent past medical history.  Patient Active Problem List   Diagnosis Date Noted  . FRACTURE, TOE 09/05/2009  . STRESS FRACTURE OF THE METATARSALS 03/29/2009  . METATARSALGIA 03/09/2009  . FOOT PAIN 03/09/2009    Past Surgical History:  Procedure Laterality Date  . PARTIAL HYSTERECTOMY       OB History   No obstetric history on file.     Family History  Problem Relation Age of Onset  . Cancer Other   . Hypertension Other     Social History   Tobacco Use  . Smoking status: Never Smoker  . Smokeless tobacco: Never Used  Substance Use Topics  . Alcohol use: Yes    Comment: occ  . Drug use: No    Home Medications Prior to Admission medications   Medication Sig Start Date End Date Taking? Authorizing Provider  Vitamin D, Ergocalciferol, (DRISDOL) 1.25 MG (50000 UNIT) CAPS capsule Take 50,000 Units by mouth once a week. 09/27/20  Yes [provider]    Allergies    Bee venom and Penicillins  Review of Systems   Review of Systems  Musculoskeletal: Positive for arthralgias and myalgias.  Skin: Negative for color change and wound.  Neurological: Negative for weakness and numbness.    Physical Exam Updated Vital Signs BP 118/70 (BP Location: Right Arm)   Pulse  61   Temp 97.6 F (36.4 C) (Oral)   Resp 18   SpO2 100%   Physical Exam Vitals and nursing note reviewed.  Constitutional:      General: She is not in acute distress.    Appearance: She is well-developed.  HENT:     Head: Normocephalic and atraumatic.     Right Ear: External ear normal.     Left Ear: External ear normal.  Eyes:     General: No scleral icterus.       Right eye: No discharge.        Left eye: No discharge.     Conjunctiva/sclera: Conjunctivae normal.  Neck:     Trachea: No tracheal deviation.  Cardiovascular:     Rate and Rhythm: Normal rate.  Pulmonary:     Effort: Pulmonary effort is normal. No respiratory distress.     Breath sounds: No stridor.  Abdominal:     General: There is no distension.  Musculoskeletal:        General: Tenderness present. No swelling or deformity.     Cervical back: Neck supple.     Comments: Moderate TTP noted along the dorsum of the left hand at the base of the first metacarpal just distal to the radial aspect of the left wrist.  No snuffbox tenderness.  Full range of motion of  the left wrist and all of the fingers of the left hand.  Grip strength is 5 out of 5.  2+ radial pulses.  Good cap refill.  Distal sensation intact.  Skin:    General: Skin is warm and dry.     Findings: No rash.  Neurological:     Mental Status: She is alert.     Cranial Nerves: Cranial nerve deficit: no gross deficits.    ED Results / Procedures / Treatments   Labs (all labs ordered are listed, but only abnormal results are displayed) Labs Reviewed - No data to display  EKG None  Radiology DG Wrist Complete Left  Result Date: 10/20/2020 CLINICAL DATA:  fall with pain EXAM: LEFT HAND - COMPLETE 3+ VIEW; LEFT WRIST - COMPLETE 3+ VIEW COMPARISON:  None. FINDINGS: There is no evidence of fracture or dislocation. Mild degenerative changes involving the radiocarpal joint. First interphalangeal joint degenerative spurring. Soft tissues are  unremarkable. IMPRESSION: No acute osseous abnormality. Electronically Signed   By: Stana Bunting M.D.   On: 10/20/2020 10:20   DG Hand Complete Left  Result Date: 10/20/2020 CLINICAL DATA:  fall with pain EXAM: LEFT HAND - COMPLETE 3+ VIEW; LEFT WRIST - COMPLETE 3+ VIEW COMPARISON:  None. FINDINGS: There is no evidence of fracture or dislocation. Mild degenerative changes involving the radiocarpal joint. First interphalangeal joint degenerative spurring. Soft tissues are unremarkable. IMPRESSION: No acute osseous abnormality. Electronically Signed   By: Stana Bunting M.D.   On: 10/20/2020 10:20   Procedures Procedures (including critical care time)  Medications Ordered in ED Medications  ibuprofen (ADVIL) tablet 800 mg (has no administration in time range)  acetaminophen (TYLENOL) tablet 650 mg (has no administration in time range)    ED Course  I have reviewed the triage vital signs and the nursing notes.  Pertinent labs & imaging results that were available during my care of the patient were reviewed by me and considered in my medical decision making (see chart for details).    MDM Rules/Calculators/A&P                          Pt is a 64 y.o. female that presents with a history, physical exam, and ED Clinical Course as noted above.   Patient presents today with left hand pain after a fall that occurred just prior to arrival.  We obtained x-rays of the left wrist and hand which were negative for any osseous abnormalities.  Physical exam was significant for some moderate tenderness at the base of the first metacarpal of the left hand.  Patient has full range of motion of the left wrist and all the fingers of the left hand.  She is neurovascularly intact in the left hand.  We will place patient in a thumb spica splint.  Recommended range of motion exercises as her pain tolerates.  Tylenol and ibuprofen for management of her pain.  We discussed dosing.  We discussed  multiple topical analgesics as well.  RICE method.   Patient is hemodynamically stable and in NAD at the time of d/c. Evaluation does not show pathology that would require ongoing emergent intervention or inpatient treatment. I explained the diagnosis to the patient. Patient is comfortable with above plan and is stable for discharge at this time. All questions were answered prior to disposition. Strict return precautions for returning to the ED were discussed. Encouraged follow up with PCP.    An After Visit Summary  was printed and given to the patient.  Patient discharged to home/self care.  Condition at discharge: Stable  Note: Portions of this report may have been transcribed using voice recognition software. Every effort was made to ensure accuracy; however, inadvertent computerized transcription errors may be present.   Final Clinical Impression(s) / ED Diagnoses Final diagnoses:  Hand sprain, left, initial encounter    Rx / DC Orders ED Discharge Orders    None       Placido Sou, PA-C 10/20/20 1101    Pricilla Loveless, MD 10/21/20 [phone]

## 2020-10-20 NOTE — ED Notes (Signed)
Ortho tech called for thumb spica.  

## 2020-10-20 NOTE — Discharge Instructions (Signed)
I recommend a combination of tylenol and ibuprofen for management of your pain. You can take a low dose of both at the same time. I recommend 325 mg of Tylenol combined with 400 mg of ibuprofen. This is one regular Tylenol and two regular ibuprofen. You can take these 2-3 times for day for your pain. Please try to take these medications with a small amount of food as well to prevent upsetting your stomach.  Also, please consider topical pain relieving creams such as Voltaran Gel, BioFreeze, or Icy Hot. There is also a pain relieving cream made by [employer]. You should be able to find all of these at your local pharmacy.   Please adhere to the "RICE Method" for rehabbing. This stands for "rest, ice, compress, and elevate". Please continue to perform stretches and exercise as much as your pain allows. It is important that you keep moving to help heal your injury.   Please return to the ER with any new or worsening symptoms.  It was a pleasure to meet you. 

## 2020-10-20 NOTE — ED Triage Notes (Signed)
Trip and fall while working out this morning. Landed on left wrist. No deformity. Denies head injury

## 2021-01-05 ENCOUNTER — Encounter (HOSPITAL_COMMUNITY): Payer: Self-pay

## 2021-01-05 ENCOUNTER — Ambulatory Visit (HOSPITAL_COMMUNITY)
Admission: EM | Admit: 2021-01-05 | Discharge: 2021-01-05 | Disposition: A | Discharge status: Home / Self Care | Payer: 59 | Attending: Internal Medicine | Admitting: Internal Medicine

## 2021-01-05 ENCOUNTER — Other Ambulatory Visit: Payer: Self-pay

## 2021-01-05 DIAGNOSIS — U071 COVID-19: Secondary | ICD-10-CM | POA: Diagnosis not present

## 2021-01-05 DIAGNOSIS — I10 Essential (primary) hypertension: Secondary | ICD-10-CM | POA: Insufficient documentation

## 2021-01-05 DIAGNOSIS — J069 Acute upper respiratory infection, unspecified: Secondary | ICD-10-CM | POA: Diagnosis present

## 2021-01-05 LAB — SARS CORONAVIRUS 2 (TAT 6-24 HRS): SARS Coronavirus 2: POSITIVE — AB

## 2021-01-05 NOTE — ED Provider Notes (Signed)
[facility]    CSN: [id number] Arrival date & time: 01/05/21  5427      History   Chief Complaint Chief Complaint  Patient presents with  . Sore Throat  . Headache    HPI Alejandra Scott is a 65 y.o. female presents to urgent care today with complaints of headache, congestion and sore throat.  Patient reports symptoms ongoing x2days.  Had fever at onset of symptoms but none.  Patient denies any chest pain, shortness of breath, abdominal pain, N/V/D.  Mild nonproductive cough.  Patient requesting COVID screening prior to returning to work.   History reviewed. No pertinent past medical history.  Patient Active Problem List   Diagnosis Date Noted  . FRACTURE, TOE 09/05/2009  . STRESS FRACTURE OF THE METATARSALS 03/29/2009  . METATARSALGIA 03/09/2009  . FOOT PAIN 03/09/2009    Past Surgical History:  Procedure Laterality Date  . PARTIAL HYSTERECTOMY      OB History   No obstetric history on file.      Home Medications    Prior to Admission medications   Medication Sig Start Date End Date Taking? Authorizing Provider  Vitamin D, Ergocalciferol, (DRISDOL) 1.25 MG (50000 UNIT) CAPS capsule Take 50,000 Units by mouth once a week. 09/27/20   [provider]    Family History Family History  Problem Relation Age of Onset  . Cancer Other   . Hypertension Other     Social History Social History   Tobacco Use  . Smoking status: Never Smoker  . Smokeless tobacco: Never Used  Substance Use Topics  . Alcohol use: Yes    Comment: occ  . Drug use: No     Allergies   Bee venom and Penicillins   Review of Systems As stated in HPI otherwise negative   Physical Exam Triage Vital Signs ED Triage Vitals [01/05/21 0942]  Enc Vitals Group     BP (!) 168/77     Pulse Rate 64     Resp 17     Temp 98.4 F (36.9 C)     Temp Source Oral     SpO2 97 %     Weight      Height      Head Circumference      Peak Flow      Pain Score 5      Pain Loc      Pain Edu?      Excl. in GC?    No data found.  Updated Vital Signs BP (!) 168/77 (BP Location: Left Arm)   Pulse 64   Temp 98.4 F (36.9 C) (Oral)   Resp 17   SpO2 97%   Visual Acuity Right Eye Distance:   Left Eye Distance:   Bilateral Distance:    Right Eye Near:   Left Eye Near:    Bilateral Near:     Physical Exam Constitutional:      General: She is not in acute distress.    Appearance: She is well-developed. She is not ill-appearing or toxic-appearing.  HENT:     Right Ear: Tympanic membrane normal. Tympanic membrane is not erythematous.     Left Ear: Tympanic membrane normal. Tympanic membrane is not erythematous.     Nose: Congestion present. No rhinorrhea.     Mouth/Throat:     Mouth: Mucous membranes are moist. No oral lesions.     Pharynx: Posterior oropharyngeal erythema present. No oropharyngeal exudate.     Tonsils: No tonsillar  exudate or tonsillar abscesses.  Eyes:     Conjunctiva/sclera: Conjunctivae normal.  Cardiovascular:     Rate and Rhythm: Normal rate and regular rhythm.     Heart sounds: Normal heart sounds. No murmur heard. No friction rub. No gallop.   Pulmonary:     Effort: Pulmonary effort is normal.     Breath sounds: Normal breath sounds. No wheezing, rhonchi or rales.  Abdominal:     General: Bowel sounds are normal.     Palpations: Abdomen is soft.  Musculoskeletal:     Cervical back: Normal range of motion.  Lymphadenopathy:     Cervical: No cervical adenopathy.  Skin:    General: Skin is warm and dry.  Neurological:     General: No focal deficit present.     Mental Status: She is alert.  Psychiatric:        Mood and Affect: Mood normal.        Behavior: Behavior normal.      UC Treatments / Results  Labs (all labs ordered are listed, but only abnormal results are displayed) Labs Reviewed  SARS CORONAVIRUS 2 (TAT 6-24 HRS)    EKG   Radiology No results found.  Procedures Procedures (including  critical care time)  Medications Ordered in UC Medications - No data to display  Initial Impression / Assessment and Plan / UC Course  I have reviewed the triage vital signs and the nursing notes.  Pertinent labs & imaging results that were available during my care of the patient were reviewed by me and considered in my medical decision making (see chart for details).  Viral URI Symptoms fairly mild.  VSS and patient nontoxic-appearing.  No evidence of acute sinusitis or a pneumonia requiring antibiotic therapy.  Flonase, Zyrtec, Tylenol/Motrin as needed.  COVID PCR sent with strict follow-up and isolation precautions discussed.  Reviewed expections re: course of current medical issues. Questions answered. Outlined signs and symptoms indicating need for more acute intervention. Pt verbalized understanding. AVS given   Final Clinical Impressions(s) / UC Diagnoses   Final diagnoses:  Viral upper respiratory tract infection  Essential hypertension     Discharge Instructions     Please follow-up with primary care regarding her blood pressure  COVID testing ordered. It will take between 1-2 days for test results. Someone will contact you regarding abnormal results or you can access your results through MyChart.   In the meantime you should... . Remain isolated in your home for 5 days from symptom onset if Covid + AND greater than 48 hours of no fever without the use of fever-reducing medication . Get plenty of rest and fluids . Flonase for nasal congestion and/or runny nose . You can take OTC Zyrtec-D for nasal congestion, runny nose, and/or sore throat . Use these medications as directed for symptom relief . Use Tylenol or Ibuprofen as needed for fever or pain . Return or go to the ER for any worsening or new symptoms such as high fever, worsening cough, shortness of breath, chest tightness, chest pain, changes in mental status, etc.     ED Prescriptions    None     PDMP  not reviewed this encounter.   Rolla Etienne, NP 01/05/21 1057

## 2021-01-05 NOTE — Discharge Instructions (Signed)
Please follow-up with primary care regarding her blood pressure  COVID testing ordered. It will take between 1-2 days for test results. Someone will contact you regarding abnormal results or you can access your results through MyChart.   In the meantime you should... Remain isolated in your home for 5 days from symptom onset if Covid + AND greater than 48 hours of no fever without the use of fever-reducing medication Get plenty of rest and fluids Flonase for nasal congestion and/or runny nose You can take OTC Zyrtec-D for nasal congestion, runny nose, and/or sore throat Use these medications as directed for symptom relief Use Tylenol or Ibuprofen as needed for fever or pain Return or go to the ER for any worsening or new symptoms such as high fever, worsening cough, shortness of breath, chest tightness, chest pain, changes in mental status, etc.

## 2021-01-05 NOTE — ED Triage Notes (Signed)
Pt presents with sore throat, headache, and congestion X 2 days.

## 2021-01-19 ENCOUNTER — Encounter: Payer: Self-pay | Admitting: Sports Medicine

## 2021-01-19 ENCOUNTER — Ambulatory Visit (INDEPENDENT_AMBULATORY_CARE_PROVIDER_SITE_OTHER): Payer: 59 | Admitting: Sports Medicine

## 2021-01-19 ENCOUNTER — Ambulatory Visit (INDEPENDENT_AMBULATORY_CARE_PROVIDER_SITE_OTHER): Payer: 59

## 2021-01-19 ENCOUNTER — Other Ambulatory Visit: Payer: Self-pay

## 2021-01-19 ENCOUNTER — Other Ambulatory Visit: Payer: Self-pay | Admitting: Sports Medicine

## 2021-01-19 DIAGNOSIS — M722 Plantar fascial fibromatosis: Secondary | ICD-10-CM

## 2021-01-19 DIAGNOSIS — M216X2 Other acquired deformities of left foot: Secondary | ICD-10-CM | POA: Diagnosis not present

## 2021-01-19 DIAGNOSIS — M79672 Pain in left foot: Secondary | ICD-10-CM

## 2021-01-19 MED ORDER — MELOXICAM 15 MG PO TABS: 15.0000 mg | ORAL_TABLET | Freq: Every day | ORAL | 0 refills | Status: AC

## 2021-01-19 MED ORDER — TRIAMCINOLONE ACETONIDE 10 MG/ML IJ SUSP
10.0000 mg | Freq: Once | INTRAMUSCULAR | Status: AC
  Administered 2021-01-19: 10 mg

## 2021-01-19 NOTE — Progress Notes (Signed)
Subjective: Alejandra Scott is a 65 y.o. female patient presents to office with complaint of moderate heel pain on the left. 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 Patient has treated this problem with PT, Chiro, accupunture and saw Dr. Darla Lesches with no relief. Denies any other pedal complaints.   Review of Systems  All other systems reviewed and are negative.    Patient Active Problem List   Diagnosis Date Noted  . FRACTURE, TOE 09/05/2009  . STRESS FRACTURE OF THE METATARSALS 03/29/2009  . METATARSALGIA 03/09/2009  . FOOT PAIN 03/09/2009    Current Outpatient Medications on File Prior to Visit  Medication Sig Dispense Refill  . Vitamin D, Ergocalciferol, (DRISDOL) 1.25 MG (50000 UNIT) CAPS capsule Take 50,000 Units by mouth once a week.     No current facility-administered medications on file prior to visit.    Allergies  Allergen Reactions  . Bee Venom Anaphylaxis  . Penicillins Swelling and Rash    Swelling of face and throat     Objective: Physical Exam General: The patient is alert and oriented x3 in no acute distress.  Dermatology: Skin is warm, dry and supple bilateral lower extremities. Nails 1-10 are normal. There is no erythema, edema, no eccymosis, no open lesions present. Integument is otherwise unremarkable.  Vascular: Dorsalis Pedis pulse and Posterior Tibial pulse are 2/4 bilateral. Capillary fill time is immediate to all digits.  Neurological: Grossly intact to light bilateral.  Musculoskeletal: Tenderness to palpation at the medial calcaneal tubercale and through the insertion of the plantar fascia on the left foot. No pain with compression of calcaneus bilateral. No pain with tuning fork to calcaneus bilateral. No pain with calf compression bilateral. There is decreased Ankle joint range of motion bilateral. All other joints range of motion within normal limits bilateral. Strength 5/5 in all groups bilateral. Pes planus foot type  bilateral.  Gait: Unassisted, Antalgic avoid weight on left heel  Xray, Left foot:  Normal osseous mineralization. Joint spaces preserved. No fracture/dislocation/boney destruction. Calcaneal spur present with mild thickening of plantar fascia. No other soft tissue abnormalities or radiopaque foreign bodies.   Assessment and Plan: Problem List Items Addressed This Visit   None   Visit Diagnoses    Pain in left foot    -  Primary   Relevant Orders   DG Foot Complete Left   Plantar fasciitis of left foot       Relevant Medications   triamcinolone acetonide (KENALOG) 10 MG/ML injection 10 mg (Start on 01/19/2021 10:15 AM)   Acquired equinus deformity of left foot          -Complete examination performed.  -Xrays reviewed -Discussed with patient in detail the condition of plantar fasciitis, how this occurs and general treatment options. Explained both conservative and surgical treatments.  -After oral consent and aseptic prep, injected a mixture containing 1 ml of 2%  plain lidocaine, 1 ml 0.5% plain marcaine, 0.5 ml of kenalog 10 and 0.5 ml of dexamethasone phosphate into left heel. Post-injection care discussed with patient.  -Rx Meloxicam -Recommended good supportive shoes and advised use of  Heel lifts  -Explained and dispensed to patient daily stretching exercises. -Recommend patient to ice affected area 1-2x daily. -Patient to return to office in 4 weeks for follow up or sooner if problems or questions arise.  Alejandra Scott, DPM

## 2021-01-19 NOTE — Patient Instructions (Signed)
Plantar Fasciitis Rehab Ask your health care provider which exercises are safe for you. Do exercises exactly as told by your health care provider and adjust them as directed. It is normal to feel mild stretching, pulling, tightness, or discomfort as you do these exercises. Stop right away if you feel sudden pain or your pain gets worse. Do not begin these exercises until told by your health care provider. Stretching and range-of-motion exercises These exercises warm up your muscles and joints and improve the movement and flexibility of your foot. These exercises also help to relieve pain. Plantar fascia stretch 1. Sit with your left / right leg crossed over your opposite knee. 2. Hold your heel with one hand with that thumb near your arch. With your other hand, hold your toes and gently pull them back toward the top of your foot. You should feel a stretch on the base (bottom) of your toes, or the bottom of your foot (plantar fascia), or both. 3. Hold this stretch for__________ seconds. 4. Slowly release your toes and return to the starting position. Repeat __________ times. Complete this exercise __________ times a day.   Gastrocnemius stretch, standing This exercise is also called a calf (gastroc) stretch. It stretches the muscles in the back of the upper calf. 1. Stand with your hands against a wall. 2. Extend your left / right leg behind you, and bend your front knee slightly. 3. Keeping your heels on the floor, your toes facing forward, and your back knee straight, shift your weight toward the wall. Do not arch your back. You should feel a gentle stretch in your upper calf. 4. Hold this position for __________ seconds. Repeat __________ times. Complete this exercise __________ times a day.   Soleus stretch, standing This exercise is also called a calf (soleus) stretch. It stretches the muscles in the back of the lower calf. 1. Stand with your hands against a wall. 2. Extend your left / right  leg behind you, and bend your front knee slightly. 3. Keeping your heels on the floor and your toes facing forward, bend your back knee and shift your weight slightly over your back leg. You should feel a gentle stretch deep in your lower calf. 4. Hold this position for __________ seconds. Repeat __________ times. Complete this exercise __________ times a day. Gastroc and soleus stretch, standing step This exercise stretches the muscles in the back of the lower leg. These muscles are in the upper calf (gastrocnemius) and the lower calf (soleus). 1. Stand with the ball of your left / right foot on the front of a step. The ball of your foot is on the walking surface, right under your toes. 2. Keep your other foot firmly on the same step. 3. Hold on to the wall or a railing for balance. 4. Slowly lift your other foot, allowing your body weight to press your heel down over the edge of the front of the step. Keep knee straight and unbent. You should feel a stretch in your calf. 5. Hold this position for __________ seconds. 6. Return both feet to the step. 7. Repeat this exercise with a slight bend in your left / right knee. Repeat __________ times with your left / right knee straight and __________ times with your left / right knee bent. Complete this exercise __________ times a day. Balance exercise This exercise builds your balance and strength control of your arch to help take pressure off your plantar fascia. Single leg stand If this exercise   is too easy, you can try it with your eyes closed or while standing on a pillow. 1. Without shoes, stand near a railing or in a doorway. You may hold on to the railing or door frame as needed. 2. Stand on your left / right foot. Keep your big toe down on the floor and lift the arch of your foot. You should feel a stretch across the bottom of your foot and your arch. Do not let your foot roll inward. 3. Hold this position for __________ seconds. Repeat  __________ times. Complete this exercise __________ times a day. This information is not intended to replace advice given to you by your health care provider. Make sure you discuss any questions you have with your health care provider. Document Revised: 09/22/2020 Document Reviewed: 09/22/2020 Elsevier Patient Education  2021 [employer]  

## 2021-01-27 ENCOUNTER — Telehealth: Payer: Self-pay | Admitting: General Practice

## 2021-01-27 NOTE — Telephone Encounter (Signed)
Copied from CRM [id number]. Topic: General - Other >> Jan 25, 2021  1:47 PM Alejandra Scott wrote: Reason for CRM: Patient called in to inquire if she can come in the office for her visit on 02/01/21. Please call and inform states that she need her BP checked since it was high when she was at the hospital Ph#  [phone]   Please advise.

## 2021-01-27 NOTE — Telephone Encounter (Signed)
Pt can come in person

## 2021-02-01 ENCOUNTER — Ambulatory Visit: Payer: 59 | Attending: Physician Assistant | Admitting: Physician Assistant

## 2021-02-01 ENCOUNTER — Encounter: Payer: Self-pay | Admitting: Physician Assistant

## 2021-02-01 ENCOUNTER — Other Ambulatory Visit: Payer: Self-pay

## 2021-02-01 VITALS — BP 158/93 | HR 55 | Resp 16 | Ht 64.0 in | Wt 158.0 lb

## 2021-02-01 DIAGNOSIS — Z13220 Encounter for screening for lipoid disorders: Secondary | ICD-10-CM | POA: Diagnosis not present

## 2021-02-01 DIAGNOSIS — R030 Elevated blood-pressure reading, without diagnosis of hypertension: Secondary | ICD-10-CM | POA: Diagnosis not present

## 2021-02-01 DIAGNOSIS — E559 Vitamin D deficiency, unspecified: Secondary | ICD-10-CM | POA: Diagnosis not present

## 2021-02-01 DIAGNOSIS — U071 COVID-19: Secondary | ICD-10-CM

## 2021-02-01 DIAGNOSIS — Z09 Encounter for follow-up examination after completed treatment for conditions other than malignant neoplasm: Secondary | ICD-10-CM

## 2021-02-01 DIAGNOSIS — Z131 Encounter for screening for diabetes mellitus: Secondary | ICD-10-CM | POA: Diagnosis not present

## 2021-02-01 NOTE — Patient Instructions (Signed)
Drink 80-100 ounces water daily  Check blood pressure daily and record

## 2021-02-01 NOTE — Progress Notes (Signed)
Patient ID: Alejandra Scott, female   DOB: 04-30-1956, 65 y.o.   MRN: [id number]   Alejandra Scott, is a 65 y.o. female  [id number]  [id number]  DOB - [birth year]  Subjective:  Chief Complaint and HPI: Alejandra Scott is a 65 y.o. female here today to establish care and for a follow up visiT AFTER HAVING cOVID 3 WEEKS AGO AND being seen in the ED.  She is feeling good.  No s/sx.  She is vegan.  usu BP is good.  No HA/dizziness/CP.  She has a h/o vitamin D deficiency.  MMG < 1year ago and normal.    ED/Hospital notes reviewed.    ROS:   Constitutional:  No f/c, No night sweats, No unexplained weight loss. EENT:  No vision changes, No blurry vision, No hearing changes. No mouth, throat, or ear problems.  Respiratory: No cough, No SOB Cardiac: No CP, no palpitations GI:  No abd pain, No N/V/D. GU: No Urinary s/sx Musculoskeletal: No joint pain Neuro: No headache, no dizziness, no motor weakness.  Skin: No rash Endocrine:  No polydipsia. No polyuria.  Psych: Denies SI/HI  Problem  Vitamin D Deficiency    ALLERGIES: Allergies  Allergen Reactions  . Bee Venom Anaphylaxis  . Penicillins Swelling and Rash    Swelling of face and throat     PAST MEDICAL HISTORY: No past medical history on file.  MEDICATIONS AT HOME: Prior to Admission medications   Medication Sig Start Date End Date Taking? Authorizing Provider  meloxicam (MOBIC) 15 MG tablet Take 1 tablet (15 mg total) by mouth daily. Patient not taking: Reported on 02/01/2021 01/19/21   [name], DPM  Vitamin D, Ergocalciferol, (DRISDOL) 1.25 MG (50000 UNIT) CAPS capsule Take 50,000 Units by mouth once a week. Patient not taking: Reported on 02/01/2021 09/27/20   [provider]     Objective:  EXAM:   Vitals:   02/01/21 0929  BP: (!) 158/93  Pulse: (!) 55  Resp: 16  SpO2: 99%  Weight: 158 lb (71.7 kg)  Height: 5\' 4"  (1.626 m)    General appearance : A&OX3. NAD. Non-toxic-appearing HEENT:  Atraumatic and Normocephalic.  PERRLA. EOM intact.   Chest/Lungs:  Breathing-non-labored, Good air entry bilaterally, breath sounds normal without rales, rhonchi, or wheezing  CVS: S1 S2 regular, no murmurs, gallops, rubs  Extremities: Bilateral Lower Ext shows no edema, both legs are warm to touch with = pulse throughout Neurology:  CN II-XII grossly intact, Non focal.   Psych:  TP linear. J/I WNL. Normal speech. Appropriate eye contact and affect.  Skin:  No Rash  Data Review No results found for: HGBA1C   Assessment & Plan   1. Vitamin D deficiency - Vitamin D, 25-hydroxy  2. Screening for diabetes mellitus I have had a lengthy discussion and provided education about insulin resistance and the intake of too much sugar/refined carbohydrates.  I have advised the patient to work at a goal of eliminating sugary drinks, candy, desserts, sweets, refined sugars, processed foods, and white carbohydrates.  The patient expresses understanding.  - Hemoglobin A1c  3. Screening, lipid - Lipid panel  4. Elevated blood pressure reading Check BP OOO daily and record and bring to next visit.  BP 10/20/2020 =124/75 - Comprehensive metabolic panel  5. COVID resolved  6. Encounter for examination following treatment at hospital Doing great   Patient have been counseled extensively about nutrition and exercise  Return in about 6 weeks (around 03/15/2021) for assign PCP recheck BP.  The patient was given clear instructions to go to ER or return to medical center if symptoms don't improve, worsen or new problems develop. The patient verbalized understanding. The patient was told to call to get lab results if they haven't heard anything in the next week.     Georgian Co, PA-C [facility] [place], [place] [phone]   02/01/2021, 9:43 AM

## 2021-02-02 LAB — LIPID PANEL
Chol/HDL Ratio: 3.1 ratio (ref 0.0–4.4)
Cholesterol, Total: 182 mg/dL (ref 100–199)
HDL: 58 mg/dL (ref >39)
LDL Chol Calc (NIH): 101 mg/dL — ABNORMAL HIGH (ref 0–99)
Triglycerides: 134 mg/dL (ref 0–149)
VLDL Cholesterol Cal: 23 mg/dL (ref 5–40)

## 2021-02-02 LAB — COMPREHENSIVE METABOLIC PANEL
ALT: 21 IU/L (ref 0–32)
AST: 24 IU/L (ref 0–40)
Albumin/Globulin Ratio: 2.4 — ABNORMAL HIGH (ref 1.2–2.2)
Albumin: 4.6 g/dL (ref 3.8–4.8)
Alkaline Phosphatase: 104 IU/L (ref 44–121)
BUN/Creatinine Ratio: 15 (ref 12–28)
BUN: 11 mg/dL (ref 8–27)
Bilirubin Total: 0.3 mg/dL (ref 0.0–1.2)
CO2: 24 mmol/L (ref 20–29)
Calcium: 9.4 mg/dL (ref 8.7–10.3)
Chloride: 103 mmol/L (ref 96–106)
Creatinine, Ser: 0.75 mg/dL (ref 0.57–1.00)
GFR calc Af Amer: 97 mL/min/{1.73_m2} (ref >59)
GFR calc non Af Amer: 85 mL/min/{1.73_m2} (ref >59)
Globulin, Total: 1.9 g/dL (ref 1.5–4.5)
Glucose: 91 mg/dL (ref 65–99)
Potassium: 4.4 mmol/L (ref 3.5–5.2)
Sodium: 140 mmol/L (ref 134–144)
Total Protein: 6.5 g/dL (ref 6.0–8.5)

## 2021-02-02 LAB — VITAMIN D 25 HYDROXY (VIT D DEFICIENCY, FRACTURES): Vit D, 25-Hydroxy: 28.6 ng/mL — ABNORMAL LOW (ref 30.0–100.0)

## 2021-02-02 LAB — HEMOGLOBIN A1C
Est. average glucose Bld gHb Est-mCnc: 117 mg/dL
Hgb A1c MFr Bld: 5.7 % — ABNORMAL HIGH (ref 4.8–5.6)

## 2021-02-16 ENCOUNTER — Ambulatory Visit: Payer: 59 | Admitting: Sports Medicine

## 2021-03-16 ENCOUNTER — Ambulatory Visit: Payer: 59 | Admitting: Sports Medicine

## 2021-03-20 ENCOUNTER — Ambulatory Visit: Payer: 59 | Admitting: Nurse Practitioner

## 2022-06-30 ENCOUNTER — Emergency Department (HOSPITAL_BASED_OUTPATIENT_CLINIC_OR_DEPARTMENT_OTHER): Payer: PPO

## 2022-06-30 ENCOUNTER — Encounter (HOSPITAL_BASED_OUTPATIENT_CLINIC_OR_DEPARTMENT_OTHER): Payer: Self-pay

## 2022-06-30 ENCOUNTER — Emergency Department (HOSPITAL_BASED_OUTPATIENT_CLINIC_OR_DEPARTMENT_OTHER)
Admission: EM | Admit: 2022-06-30 | Discharge: 2022-06-30 | Disposition: A | Discharge status: Home / Self Care | Payer: PPO | Attending: Emergency Medicine | Admitting: Emergency Medicine

## 2022-06-30 DIAGNOSIS — S0181XA Laceration without foreign body of other part of head, initial encounter: Secondary | ICD-10-CM

## 2022-06-30 DIAGNOSIS — Z23 Encounter for immunization: Secondary | ICD-10-CM | POA: Diagnosis not present

## 2022-06-30 DIAGNOSIS — S80812A Abrasion, left lower leg, initial encounter: Secondary | ICD-10-CM | POA: Diagnosis not present

## 2022-06-30 DIAGNOSIS — S40811A Abrasion of right upper arm, initial encounter: Secondary | ICD-10-CM | POA: Diagnosis not present

## 2022-06-30 DIAGNOSIS — Y92410 Unspecified street and highway as the place of occurrence of the external cause: Secondary | ICD-10-CM | POA: Insufficient documentation

## 2022-06-30 DIAGNOSIS — S0993XA Unspecified injury of face, initial encounter: Secondary | ICD-10-CM | POA: Diagnosis present

## 2022-06-30 MED ORDER — TETANUS-DIPHTH-ACELL PERTUSSIS 5-2.5-18.5 LF-MCG/0.5 IM SUSY
0.5000 mL | PREFILLED_SYRINGE | Freq: Once | INTRAMUSCULAR | Status: AC
  Administered 2022-06-30: 0.5 mL via INTRAMUSCULAR
  Filled 2022-06-30: qty 0.5

## 2022-06-30 MED ORDER — DOXYCYCLINE HYCLATE 100 MG PO CAPS: 100.0000 mg | ORAL_CAPSULE | Freq: Two times a day (BID) | ORAL | 0 refills | Status: AC
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
## 2022-06-30 NOTE — Discharge Instructions (Signed)
Please keep your face clean and dry for the next several days.  Take antibiotic to help prevent infection.

## 2022-07-02 ENCOUNTER — Other Ambulatory Visit: Payer: Self-pay

## 2022-07-02 ENCOUNTER — Encounter (HOSPITAL_BASED_OUTPATIENT_CLINIC_OR_DEPARTMENT_OTHER): Payer: Self-pay

## 2022-07-02 ENCOUNTER — Emergency Department (HOSPITAL_BASED_OUTPATIENT_CLINIC_OR_DEPARTMENT_OTHER)
Admission: EM | Admit: 2022-07-02 | Discharge: 2022-07-02 | Disposition: A | Discharge status: Home / Self Care | Payer: PPO | Attending: Emergency Medicine | Admitting: Emergency Medicine

## 2022-07-02 ENCOUNTER — Encounter: Payer: Self-pay | Admitting: Physician Assistant

## 2022-07-02 DIAGNOSIS — Z5189 Encounter for other specified aftercare: Secondary | ICD-10-CM

## 2022-07-02 DIAGNOSIS — Z4800 Encounter for change or removal of nonsurgical wound dressing: Secondary | ICD-10-CM | POA: Insufficient documentation
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
## 2022-07-02 NOTE — Discharge Instructions (Signed)
Return here for fever, facial swelling, redness, any other problems

## 2022-08-31 ENCOUNTER — Other Ambulatory Visit: Payer: Self-pay

## 2022-08-31 DIAGNOSIS — U071 COVID-19: Secondary | ICD-10-CM | POA: Diagnosis not present

## 2022-08-31 DIAGNOSIS — J029 Acute pharyngitis, unspecified: Secondary | ICD-10-CM | POA: Diagnosis present

## 2022-08-31 NOTE — ED Triage Notes (Signed)
Patient coming to ED for evaluation of sore throat.  Reports symptoms started recently.  States "the pain is now going up into my ears."

## 2022-09-01 ENCOUNTER — Emergency Department (HOSPITAL_BASED_OUTPATIENT_CLINIC_OR_DEPARTMENT_OTHER)
Admission: EM | Admit: 2022-09-01 | Discharge: 2022-09-01 | Disposition: A | Discharge status: Home / Self Care | Payer: PPO | Attending: Emergency Medicine | Admitting: Emergency Medicine

## 2022-09-01 DIAGNOSIS — U071 COVID-19: Secondary | ICD-10-CM

## 2022-09-01 LAB — SARS CORONAVIRUS 2 BY RT PCR: SARS Coronavirus 2 by RT PCR: POSITIVE — AB

## 2022-09-01 LAB — GROUP A STREP BY PCR: Group A Strep by PCR: NOT DETECTED

## 2022-09-01 NOTE — ED Provider Notes (Signed)
[facility] EMERGENCY DEPT Provider Note   CSN: [id number] Arrival date & time: 08/31/22  2232     History  Chief Complaint  Patient presents with   Sore Throat    Alejandra Scott is a 66 y.o. female.  Patient is a 66 year old female with no significant past medical history.  She presents today for evaluation of sore throat.  This has been worsening over the past day and a half.  She denies any fevers or chills.  She denies any ill contacts.  Pain is worse when she coughs or swallows.  There are no alleviating factors.  The history is provided by the patient.       Home Medications Prior to Admission medications   Medication Sig Start Date End Date Taking? Authorizing Provider  meloxicam (MOBIC) 15 MG tablet Take 1 tablet (15 mg total) by mouth daily. Patient not taking: Reported on 02/01/2021 01/19/21   [name], DPM  Vitamin D, Ergocalciferol, (DRISDOL) 1.25 MG (50000 UNIT) CAPS capsule Take 50,000 Units by mouth once a week. Patient not taking: Reported on 02/01/2021 09/27/20   [provider]      Allergies    Bee venom and Penicillins    Review of Systems   Review of Systems  All other systems reviewed and are negative.   Physical Exam Updated Vital Signs BP (!) 137/117 (BP Location: Right Arm)   Pulse (!) 54   Temp 98.2 F (36.8 C) (Oral)   Resp (!) 24   Ht 5\' 4"  (1.626 m)   Wt 68 kg   SpO2 100%   BMI 25.75 kg/m  Physical Exam Vitals and nursing note reviewed.  Constitutional:      General: She is not in acute distress.    Appearance: She is well-developed. She is not diaphoretic.  HENT:     Head: Normocephalic and atraumatic.     Mouth/Throat:     Pharynx: Posterior oropharyngeal erythema present. No oropharyngeal exudate.     Tonsils: No tonsillar exudate or tonsillar abscesses.  Cardiovascular:     Rate and Rhythm: Normal rate and regular rhythm.     Heart sounds: No murmur heard.    No friction rub. No gallop.   Pulmonary:     Effort: Pulmonary effort is normal. No respiratory distress.     Breath sounds: Normal breath sounds. No wheezing.  Abdominal:     General: Bowel sounds are normal. There is no distension.     Palpations: Abdomen is soft.     Tenderness: There is no abdominal tenderness.  Musculoskeletal:        General: Normal range of motion.     Cervical back: Normal range of motion and neck supple.  Skin:    General: Skin is warm and dry.  Neurological:     General: No focal deficit present.     Mental Status: She is alert and oriented to person, place, and time.     ED Results / Procedures / Treatments   Labs (all labs ordered are listed, but only abnormal results are displayed) Labs Reviewed  SARS CORONAVIRUS 2 BY RT PCR - Abnormal; Notable for the following components:      Result Value   SARS Coronavirus 2 by RT PCR POSITIVE (*)    All other components within normal limits  GROUP A STREP BY PCR    EKG None  Radiology No results found.  Procedures Procedures    Medications Ordered in ED Medications - No  data to display  ED Course/ Medical Decision Making/ A&P  Patient presenting here with URI symptoms as described in the HPI.  Her COVID test is positive.  Patient offered antivirals, however she declines.  Patient to be discharged with over-the-counter medications, plenty of fluids, and return as needed.  She understands to isolate for the next 5 days and return to the ER if symptoms worsen or change.  Alejandra Scott was evaluated in Emergency Department on 09/01/2022 for the symptoms described in the history of present illness. She was evaluated in the context of the global COVID-19 pandemic, which necessitated consideration that the patient might be at risk for infection with the SARS-CoV-2 virus that causes COVID-19. Institutional protocols and algorithms that pertain to the evaluation of patients at risk for COVID-19 are in a state of rapid change based on  information released by regulatory bodies including the CDC and federal and state organizations. These policies and algorithms were followed during the patient's care in the ED.   Final Clinical Impression(s) / ED Diagnoses Final diagnoses:  None    Rx / DC Orders ED Discharge Orders     None         Geoffery Lyons, MD 09/01/22 [phone]

## 2022-09-01 NOTE — Discharge Instructions (Signed)
Plenty of fluids and get plenty of rest.  Take Tylenol 1000 mg rotated with ibuprofen 600 mg every 4 hours as needed for pain or fever.  Take over-the-counter medications as needed for symptom relief.  Isolate at home for the next 5 days.  Return to the emergency department if you develop severe chest pain, difficulty breathing, or for other new and concerning symptoms.

## 2023-06-12 ENCOUNTER — Emergency Department (HOSPITAL_BASED_OUTPATIENT_CLINIC_OR_DEPARTMENT_OTHER)
Admission: EM | Admit: 2023-06-12 | Discharge: 2023-06-12 | Disposition: A | Discharge status: Home / Self Care | Payer: PPO | Attending: Emergency Medicine | Admitting: Emergency Medicine

## 2023-06-12 ENCOUNTER — Other Ambulatory Visit: Payer: Self-pay

## 2023-06-12 ENCOUNTER — Encounter (HOSPITAL_BASED_OUTPATIENT_CLINIC_OR_DEPARTMENT_OTHER): Payer: Self-pay | Admitting: Emergency Medicine

## 2023-06-12 DIAGNOSIS — M79651 Pain in right thigh: Secondary | ICD-10-CM | POA: Diagnosis not present

## 2023-06-12 DIAGNOSIS — M79604 Pain in right leg: Secondary | ICD-10-CM | POA: Diagnosis present

## 2023-06-12 NOTE — ED Provider Notes (Signed)
[place] EMERGENCY DEPARTMENT AT [facility] Provider Note   CSN: [id number] Arrival date & time: 06/12/23  0725     History  Chief Complaint  Patient presents with   Leg Pain    Alejandra Scott is a 67 y.o. female.  Patient is a 67 year old female with no significant past medical history presenting to the emergency department with right leg pain.  Patient states that she has had pain in her right anterior thigh since January.  She states that she was taking a class and sitting for up to 2-1/2 hours at a time when her pain started.  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  She states that since then the pain has been getting worse and tends to worsen whenever she is sitting.  She states that it feels like shooting type of pain down her thigh just on the anterior aspect of her thigh.  She denies any numbness or weakness, saddle anesthesia, loss of bowel or bladder function.  She states that she does go to the gym regularly and has tried to stretch and roll out her thigh but has had no significant improvement.  She denies wearing any tight belts.  She denies any trauma or falls.  She states that she is still been able to ambulate normally and walked several miles this morning.  The history is provided by the patient.  Leg Pain      Home Medications Prior to Admission medications   Medication Sig Start Date End Date Taking? Authorizing Provider  meloxicam (MOBIC) 15 MG tablet Take 1 tablet (15 mg total) by mouth daily. Patient not taking: Reported on 02/01/2021 01/19/21   [name], DPM  Vitamin D, Ergocalciferol, (DRISDOL) 1.25 MG (50000 UNIT) CAPS capsule Take 50,000 Units by mouth once a week. Patient not taking: Reported on 02/01/2021 09/27/20   [provider]      Allergies    Bee venom and Penicillins    Review of Systems   Review of Systems  Physical Exam Updated Vital Signs BP 131/81  (BP Location: Right Arm)   Pulse (!) 57   Temp 98.6 F (37 C) (Oral)   Resp 18   Ht 5\' 4"  (1.626 m)   Wt 68 kg   SpO2 99%   BMI 25.75 kg/m  Physical Exam Vitals and nursing note reviewed.  Constitutional:      General: She is not in acute distress.    Appearance: Normal appearance.  HENT:     Head: Normocephalic and atraumatic.     Nose: Nose normal.  Eyes:     Extraocular Movements: Extraocular movements intact.  Cardiovascular:     Rate and Rhythm: Normal rate.     Pulses: Normal pulses.  Pulmonary:     Effort: Pulmonary effort is normal.  Musculoskeletal:        General: Normal range of motion.     Cervical back: Normal range of motion.     Comments: No midline back tenderness, no bony tenderness to bilateral lower extremities, hip flexion, knee flexion/extension and plantar/dorsi flexion intact bilaterally without reproducible pain, negative straight leg raise bilaterally  Skin:    General: Skin is warm and dry.  Neurological:     General: No focal deficit present.     Mental Status: She is alert and oriented to person, place, and time.     Comments: Strength 5 out of 5 in bilateral  lower extremities, sensation intact  Psychiatric:        Mood and Affect: Mood normal.        Behavior: Behavior normal.     ED Results / Procedures / Treatments   Labs (all labs ordered are listed, but only abnormal results are displayed) Labs Reviewed - No data to display  EKG None  Radiology No results found.  Procedures Procedures    Medications Ordered in ED Medications - No data to display  ED Course/ Medical Decision Making/ A&P                             Medical Decision Making This patient presents to the ED with chief complaint(s) of R thigh pain with no pertinent past medical history which further complicates the presenting complaint. The complaint involves an extensive differential diagnosis and also carries with it a high risk of complications and  morbidity.    The differential diagnosis includes trauma or falls no bony tenderness making fracture, dislocation or bony abnormality unlikely, considering muscle strain or spasm, possible nerve pain such as meralgia paresthetica, no focal neurologic weakness making cauda equina or nerve root compression unlikely, negative straight leg raise making sciatica unlikely  Additional history obtained: Additional history obtained from N/A Records reviewed N/A  ED Course and Reassessment: On patient's arrival she is well-appearing in no acute distress.  She is neurovascularly intact with 5 out of 5 strength bilaterally and without reproducible pain.  She likely has a muscle strain or spasm versus meralgia paresthetica and was recommended symptomatic management with primary care follow-up and she was given strict return precautions.  Independent labs interpretation:  N/A  Independent visualization of imaging: -N/A  Consultation: - Consulted or discussed management/test interpretation w/ external professional: N/A  Consideration for admission or further workup: Patient has no emergent conditions requiring admission or further work-up at this time and is stable for discharge home with primary care follow-up  Social Determinants of health: N/A            Final Clinical Impression(s) / ED Diagnoses Final diagnoses:  Pain of right thigh    Rx / DC Orders ED Discharge Orders     None         Rexford Maus, DO 06/12/23 0805

## 2023-06-12 NOTE — Discharge Instructions (Addendum)
You were seen in the emergency department for your thigh pain.  You likely strained a muscle or have a nerve compression called meralgia paresthetica.  You can try to wear looser clothing and make sure that you are getting up and moving throughout the day and not staying in a sitting position for prolonged period of time.  You can take Tylenol and Motrin as needed for pain.  You should try to stretch her muscles as well.  You can follow-up with your primary doctor to have your symptoms rechecked and you may need to have physical therapy due to the prolonged symptoms.  You should return to the emergency department if you have numbness or weakness in your leg, you are unable to walk, you are unable to urinate, your leg is swollen or if you have any other new or concerning symptoms.

## 2023-06-12 NOTE — ED Triage Notes (Signed)
Pt arrives to ED with c/o on-going right leg pain for several months.

## 2023-11-17 ENCOUNTER — Emergency Department (HOSPITAL_BASED_OUTPATIENT_CLINIC_OR_DEPARTMENT_OTHER)
Admission: EM | Admit: 2023-11-17 | Discharge: 2023-11-17 | Disposition: A | Discharge status: Home / Self Care | Payer: PPO | Attending: Emergency Medicine | Admitting: Emergency Medicine

## 2023-11-17 ENCOUNTER — Encounter (HOSPITAL_BASED_OUTPATIENT_CLINIC_OR_DEPARTMENT_OTHER): Payer: Self-pay

## 2023-11-17 ENCOUNTER — Other Ambulatory Visit: Payer: Self-pay

## 2023-11-17 ENCOUNTER — Emergency Department (HOSPITAL_BASED_OUTPATIENT_CLINIC_OR_DEPARTMENT_OTHER): Payer: PPO | Admitting: Radiology

## 2023-11-17 DIAGNOSIS — R079 Chest pain, unspecified: Secondary | ICD-10-CM

## 2023-11-17 DIAGNOSIS — R001 Bradycardia, unspecified: Secondary | ICD-10-CM | POA: Diagnosis not present

## 2023-11-17 LAB — BASIC METABOLIC PANEL
Anion gap: 7 (ref 5–15)
BUN: 9 mg/dL (ref 8–23)
CO2: 26 mmol/L (ref 22–32)
Calcium: 8.8 mg/dL — ABNORMAL LOW (ref 8.9–10.3)
Chloride: 107 mmol/L (ref 98–111)
Creatinine, Ser: 0.68 mg/dL (ref 0.44–1.00)
GFR, Estimated: >60 mL/min (ref >60)
Glucose, Bld: 94 mg/dL (ref 70–99)
Potassium: 3.7 mmol/L (ref 3.5–5.1)
Sodium: 140 mmol/L (ref 135–145)

## 2023-11-17 LAB — CBC WITH DIFFERENTIAL/PLATELET
Abs Immature Granulocytes: 0.02 10*3/uL (ref 0.00–0.07)
Basophils Absolute: 0 10*3/uL (ref 0.0–0.1)
Basophils Relative: 1 %
Eosinophils Absolute: 0.1 10*3/uL (ref 0.0–0.5)
Eosinophils Relative: 1 %
HCT: 38.1 % (ref 36.0–46.0)
Hemoglobin: 13.3 g/dL (ref 12.0–15.0)
Immature Granulocytes: 0 %
Lymphocytes Relative: 29 %
Lymphs Abs: 1.8 10*3/uL (ref 0.7–4.0)
MCH: 31.2 pg (ref 26.0–34.0)
MCHC: 34.9 g/dL (ref 30.0–36.0)
MCV: 89.4 fL (ref 80.0–100.0)
Monocytes Absolute: 0.4 10*3/uL (ref 0.1–1.0)
Monocytes Relative: 6 %
Neutro Abs: 3.8 10*3/uL (ref 1.7–7.7)
Neutrophils Relative %: 63 %
Platelets: 203 10*3/uL (ref 150–400)
RBC: 4.26 MIL/uL (ref 3.87–5.11)
RDW: 13.6 % (ref 11.5–15.5)
WBC: 6.1 10*3/uL (ref 4.0–10.5)
nRBC: 0 % (ref 0.0–0.2)

## 2023-11-17 LAB — TROPONIN I (HIGH SENSITIVITY): Troponin I (High Sensitivity): 11 ng/L (ref <18)

## 2023-11-17 MED ORDER — LIDOCAINE 5 % EX PTCH
1.0000 | MEDICATED_PATCH | Freq: Once | CUTANEOUS | Status: DC
  Administered 2023-11-17: 1 via TRANSDERMAL
  Filled 2023-11-17: qty 1

## 2023-11-17 MED ORDER — CYCLOBENZAPRINE HCL 10 MG PO TABS: 10.0000 mg | ORAL_TABLET | Freq: Two times a day (BID) | ORAL | 0 refills | Status: AC | PRN

## 2023-11-17 MED ORDER — LIDOCAINE 5 % EX PTCH: 1.0000 | MEDICATED_PATCH | Freq: Every day | CUTANEOUS | 0 refills | Status: AC | PRN

## 2023-11-17 MED ORDER — IBUPROFEN 600 MG PO TABS: 600.0000 mg | ORAL_TABLET | Freq: Four times a day (QID) | ORAL | 0 refills | Status: AC | PRN

## 2023-11-17 MED ORDER — ACETAMINOPHEN 500 MG PO TABS
1000.0000 mg | ORAL_TABLET | Freq: Once | ORAL | Status: AC
  Administered 2023-11-17: 1000 mg via ORAL
  Filled 2023-11-17: qty 2

## 2023-11-17 MED ORDER — KETOROLAC TROMETHAMINE 15 MG/ML IJ SOLN
15.0000 mg | Freq: Once | INTRAMUSCULAR | Status: AC
  Administered 2023-11-17: 15 mg via INTRAVENOUS
  Filled 2023-11-17: qty 1

## 2023-11-17 MED ORDER — ACETAMINOPHEN 325 MG PO TABS: 650.0000 mg | ORAL_TABLET | Freq: Four times a day (QID) | ORAL | 0 refills | Status: AC | PRN

## 2023-11-17 NOTE — Discharge Instructions (Addendum)
It was a pleasure caring for you today in the emergency department.  Your labs and x-ray today are reassuring, no evidence of a heart attack or life-threatening emergency.  Your chest discomfort is likely secondary to a muscle strain or musculoskeletal injury.  Recommend avoiding heavy lifting or heavy exercise over the next few days.  Take medications as prescribed.  Follow-up with your PCP. Please return to the emergency department for any worsening or worrisome symptoms.

## 2023-11-17 NOTE — ED Provider Notes (Signed)
[place] EMERGENCY DEPARTMENT AT [facility] [place] Provider Note  CSN: [id number] Arrival date & time: 11/17/23 0534  Chief Complaint(s) Chest Pain  HPI Alejandra Scott is a 67 y.o. female with past medical history as below, significant for vitamin D deficiency, metatarsal fracture who presents to the ED with complaint of chest pain.   Patient reports left-sided sharp chest pain over the past 3 weeks.  Intermittent, worsened with direct palpation or torso twisting.  Denies any significant injury.  No palpitations, syncope presyncope, no diaphoresis.  No dyspnea.  No medications for symptoms prior to arrival.  Past Medical History History reviewed. No pertinent past medical history. 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 Authorizing Provider  acetaminophen (TYLENOL) 325 MG tablet Take 2 tablets (650 mg total) by mouth every 6 (six) hours as needed. 11/17/23  Yes Tanda Rockers A, DO  cyclobenzaprine (FLEXERIL) 10 MG tablet Take 1 tablet (10 mg total) by mouth 2 (two) times daily as needed for muscle spasms. 11/17/23  Yes Tanda Rockers A, DO  ibuprofen (ADVIL) 600 MG tablet Take 1 tablet (600 mg total) by mouth every 6 (six) hours as needed. 11/17/23  Yes Tanda Rockers A, DO  lidocaine (LIDODERM) 5 % Place 1 patch onto the skin daily as needed. Remove & Discard patch within 12 hours or as directed by MD 11/17/23  Yes Sloan Leiter, DO  meloxicam (MOBIC) 15 MG tablet Take 1 tablet (15 mg total) by mouth daily. Patient not taking: Reported on 02/01/2021 01/19/21   [name], DPM  Vitamin D, Ergocalciferol, (DRISDOL) 1.25 MG (50000 UNIT) CAPS capsule Take 50,000 Units by mouth once a week. Patient  not taking: Reported on 02/01/2021 09/27/20   [provider]                                                                                                                                    Past Surgical History Past Surgical History:  Procedure Laterality Date   PARTIAL HYSTERECTOMY     Family History Family History  Problem Relation Age of Onset   Cancer Other    Hypertension Other     Social History Social History   Tobacco Use   Smoking status: Never   Smokeless tobacco: Never  Substance Use Topics   Alcohol use: Never    Comment: occ   Drug use: Never   Allergies Bee venom and Penicillins  Review of Systems Review of Systems  Constitutional:  Negative for chills and fever.  HENT:  Negative for congestion.   Respiratory:  Negative for chest tightness and shortness of breath.   Cardiovascular:  Positive for chest pain.  Gastrointestinal:  Negative for nausea and vomiting.  Neurological:  Negative for syncope and numbness.  All other systems reviewed and are negative.   Physical Exam Vital Signs  I have reviewed the triage vital signs BP (!) 158/85 (BP Location: Right Arm)   Pulse (!) 50   Temp 98.4 F (36.9 C) (Oral)   Resp 19   Wt 64.9 kg   SpO2 100%   BMI 24.55 kg/m  Physical Exam Vitals and nursing note reviewed.  Constitutional:      General: She is not in acute distress.    Appearance: Normal appearance.  HENT:     Head: Normocephalic and atraumatic.     Right Ear: External ear normal.     Left Ear: External ear normal.     Nose: Nose normal.     Mouth/Throat:     Mouth: Mucous membranes are moist.  Eyes:     General: No scleral icterus.       Right eye: No discharge.        Left eye: No discharge.  Cardiovascular:     Rate and Rhythm: Normal rate and regular rhythm.     Pulses: Normal pulses.     Heart sounds: Normal heart sounds.  Pulmonary:     Effort: Pulmonary effort is normal. No respiratory distress.     Breath sounds:  Normal breath sounds. No stridor.  Chest:       Comments: No rash Abdominal:     General: Abdomen is flat. There is no distension.     Palpations: Abdomen is soft.     Tenderness: There is no abdominal tenderness.  Musculoskeletal:     Cervical back: No rigidity.     Right lower leg: No edema.     Left lower leg: No edema.  Skin:    General: Skin is warm and dry.     Capillary Refill: Capillary refill takes less than 2 seconds.  Neurological:     Mental Status: She is alert.  Psychiatric:        Mood and Affect: Mood normal.        Behavior: Behavior normal. Behavior is cooperative.     ED Results and Treatments Labs (all labs ordered are listed, but only abnormal results are displayed) Labs Reviewed  BASIC METABOLIC PANEL - Abnormal; Notable for the following components:      Result Value   Calcium 8.8 (*)    All other components within normal limits  CBC WITH DIFFERENTIAL/PLATELET  TROPONIN I (HIGH SENSITIVITY)                                                                                                                          Radiology DG Chest 2 View  Result Date: 11/17/2023 CLINICAL DATA:  Chest pain for 3 weeks. EXAM: CHEST - 2 VIEW COMPARISON:  05/14/2020 FINDINGS: The heart size and mediastinal contours are within normal limits. Both lungs are clear.  The visualized skeletal structures are unremarkable. IMPRESSION: No active cardiopulmonary disease. Electronically Signed   By: Signa Kell M.D.   On: 11/17/2023 07:04    Pertinent labs & imaging results that were available during my care of the patient were reviewed by me and considered in my medical decision making (see MDM for details).  Medications Ordered in ED Medications  lidocaine (LIDODERM) 5 % 1 patch (1 patch Transdermal Patch Applied 11/17/23 0724)  ketorolac (TORADOL) 15 MG/ML injection 15 mg (15 mg Intravenous Given 11/17/23 0722)  acetaminophen (TYLENOL) tablet 1,000 mg (1,000 mg Oral Given  11/17/23 0981)                                                                                                                                     Procedures Procedures  (including critical care time)  Medical Decision Making / ED Course    Medical Decision Making:    Alejandra Scott is a 67 y.o. female  with past medical history as below, significant for vitamin D deficiency, metatarsal fracture who presents to the ED with complaint of chest pain. . The complaint involves an extensive differential diagnosis and also carries with it a high risk of complications and morbidity.  Serious etiology was considered. Ddx includes but is not limited to: Differential includes all life-threatening causes for chest pain. This includes but is not exclusive to acute coronary syndrome, aortic dissection, pulmonary embolism, cardiac tamponade, community-acquired pneumonia, pericarditis, musculoskeletal chest wall pain, etc.   Complete initial physical exam performed, notably the patient was in no acute distress, sinus bradycardia noted on telemetry which has been seen prior.    Reviewed and confirmed nursing documentation for past medical history, family history, social history.  Vital signs reviewed.    Get screening labs, chest x-ray  Clinical Course as of 11/17/23 0743  [facility] Nov 17, 2023  0712 Pulse Rate(!): 50 Similar prior [SG]    Clinical Course User Index [SG] Sloan Leiter, DO    Brief summary: 67 year old female here with chest pain left-sided unilateral.  Reproducible on palpation of chest wall.  Workup is stable,  The patient's chest pain is not suggestive of pulmonary embolus, cardiac ischemia, aortic dissection, pericarditis, myocarditis, pulmonary embolism, pneumothorax, pneumonia, Zoster, or esophageal perforation, or other serious etiology.  Historically not abrupt in onset, tearing or ripping, pulses symmetric. EKG nonspecific for ischemia/infarction. No dysrhythmias, brugada,  WPW, prolonged QT noted.   Troponin negative x1 (ongoing x3 wks, no delta needed). CXR reviewed. Labs without demonstration of acute pathology unless otherwise noted above. Low HEART Score: 0-3 points (0.9-1.7% risk of MACE).  Given the extremely low risk of these diagnoses further testing and evaluation for these possibilities does not appear to be indicated at this time. Patient in no distress and overall condition improved here in the ED. Detailed discussions were had with the patient regarding current findings, and need for close f/u  with PCP or on call doctor. The patient has been instructed to return immediately if the symptoms worsen in any way for re-evaluation. Patient verbalized understanding and is in agreement with current care plan. All questions answered prior to discharge.                 Additional history obtained: -Additional history obtained from spouse -External records from outside source obtained and reviewed including: Chart review including previous notes, labs, imaging, consultation notes including  Home medications, prior ED visit, primary care documentation   Lab Tests: -I ordered, reviewed, and interpreted labs.   The pertinent results include:   Labs Reviewed  BASIC METABOLIC PANEL - Abnormal; Notable for the following components:      Result Value   Calcium 8.8 (*)    All other components within normal limits  CBC WITH DIFFERENTIAL/PLATELET  TROPONIN I (HIGH SENSITIVITY)    Notable for labs stable  EKG   EKG Interpretation Date/Time:  Sunday November 17 2023 05:40:22 EST Ventricular Rate:  50 PR Interval:  168 QRS Duration:  128 QT Interval:  442 QTC Calculation: 402 R Axis:   30  Text Interpretation: Sinus bradycardia with sinus arrhythmia Right bundle branch block Abnormal ECG When compared with ECG of 14-May-2020 08:10, PREVIOUS ECG IS PRESENT similar to prior no stemi Confirmed by Tanda Rockers ([id number]) on 11/17/2023 6:53:07 AM          Imaging Studies ordered: I ordered imaging studies including chest x-ray I independently visualized the following imaging with scope of interpretation limited to determining acute life threatening conditions related to emergency care; findings noted above I independently visualized and interpreted imaging. I agree with the radiologist interpretation   Medicines ordered and prescription drug management: Meds ordered this encounter  Medications   ketorolac (TORADOL) 15 MG/ML injection 15 mg   lidocaine (LIDODERM) 5 % 1 patch   acetaminophen (TYLENOL) tablet 1,000 mg   cyclobenzaprine (FLEXERIL) 10 MG tablet    Sig: Take 1 tablet (10 mg total) by mouth 2 (two) times daily as needed for muscle spasms.    Dispense:  20 tablet    Refill:  0   acetaminophen (TYLENOL) 325 MG tablet    Sig: Take 2 tablets (650 mg total) by mouth every 6 (six) hours as needed.    Dispense:  36 tablet    Refill:  0   ibuprofen (ADVIL) 600 MG tablet    Sig: Take 1 tablet (600 mg total) by mouth every 6 (six) hours as needed.    Dispense:  30 tablet    Refill:  0   lidocaine (LIDODERM) 5 %    Sig: Place 1 patch onto the skin daily as needed. Remove & Discard patch within 12 hours or as directed by MD    Dispense:  15 patch    Refill:  0    -I have reviewed the patients home medicines and have made adjustments as needed   Consultations Obtained: na   Cardiac Monitoring: The patient was maintained on a cardiac monitor.  I personally viewed and interpreted the cardiac monitored which showed an underlying rhythm of: sinus brady Continuous pulse oximetry interpreted by myself, 99% on RA.    Social Determinants of Health:  Diagnosis or treatment significantly limited by social determinants of health: live at home   Reevaluation: After the interventions noted above, I reevaluated the patient and found that they have improved  Co morbidities that complicate the patient evaluation History  reviewed.  No pertinent past medical history.    Dispostion: Disposition decision including need for hospitalization was considered, and patient discharged from emergency department.    Final Clinical Impression(s) / ED Diagnoses Final diagnoses:  Chest pain with low risk for cardiac etiology        Sloan Leiter, DO 11/17/23 [phone]

## 2023-11-17 NOTE — ED Triage Notes (Signed)
Pt to triage c/o CP x 3 weeks under left breast. Pt denies fever chills SOB. EKG completed. PT VSS NAD PT on room air. No aspirin or Nitro.

## 2024-02-19 ENCOUNTER — Other Ambulatory Visit: Payer: Self-pay

## 2024-02-19 ENCOUNTER — Encounter (HOSPITAL_BASED_OUTPATIENT_CLINIC_OR_DEPARTMENT_OTHER): Payer: Self-pay

## 2024-02-19 DIAGNOSIS — R112 Nausea with vomiting, unspecified: Secondary | ICD-10-CM | POA: Insufficient documentation

## 2024-02-19 LAB — COMPREHENSIVE METABOLIC PANEL
ALT: 15 U/L (ref 0–44)
AST: 23 U/L (ref 15–41)
Albumin: 4.6 g/dL (ref 3.5–5.0)
Alkaline Phosphatase: 66 U/L (ref 38–126)
Anion gap: 9 (ref 5–15)
BUN: 13 mg/dL (ref 8–23)
CO2: 25 mmol/L (ref 22–32)
Calcium: 9.6 mg/dL (ref 8.9–10.3)
Chloride: 102 mmol/L (ref 98–111)
Creatinine, Ser: 0.86 mg/dL (ref 0.44–1.00)
GFR, Estimated: >60 mL/min (ref >60)
Glucose, Bld: 114 mg/dL — ABNORMAL HIGH (ref 70–99)
Potassium: 4 mmol/L (ref 3.5–5.1)
Sodium: 136 mmol/L (ref 135–145)
Total Bilirubin: 0.5 mg/dL (ref 0.0–1.2)
Total Protein: 7 g/dL (ref 6.5–8.1)

## 2024-02-19 LAB — CBC
HCT: 42.4 % (ref 36.0–46.0)
Hemoglobin: 14.7 g/dL (ref 12.0–15.0)
MCH: 31.1 pg (ref 26.0–34.0)
MCHC: 34.7 g/dL (ref 30.0–36.0)
MCV: 89.6 fL (ref 80.0–100.0)
Platelets: 193 10*3/uL (ref 150–400)
RBC: 4.73 MIL/uL (ref 3.87–5.11)
RDW: 12.6 % (ref 11.5–15.5)
WBC: 5.3 10*3/uL (ref 4.0–10.5)
nRBC: 0 % (ref 0.0–0.2)

## 2024-02-19 LAB — RESP PANEL BY RT-PCR (RSV, FLU A&B, COVID)  RVPGX2
Influenza A by PCR: NEGATIVE
Influenza B by PCR: NEGATIVE
Resp Syncytial Virus by PCR: NEGATIVE
SARS Coronavirus 2 by RT PCR: NEGATIVE

## 2024-02-19 LAB — LIPASE, BLOOD: Lipase: 15 U/L (ref 11–51)

## 2024-02-19 MED ORDER — ONDANSETRON HCL 4 MG/2ML IJ SOLN
4.0000 mg | Freq: Once | INTRAMUSCULAR | Status: AC | PRN
  Administered 2024-02-19: 4 mg via INTRAVENOUS
  Filled 2024-02-19: qty 2

## 2024-02-19 NOTE — ED Triage Notes (Signed)
 Pt reports starting to feel achy earlier today. Pt states she starting vomiting 1 hr ago and noticed small amount of blood mixed in. Pt reports mild diffuse abdominal pain. Pt reports URI symptoms as well.

## 2024-02-20 ENCOUNTER — Emergency Department (HOSPITAL_BASED_OUTPATIENT_CLINIC_OR_DEPARTMENT_OTHER)
Admission: EM | Admit: 2024-02-20 | Discharge: 2024-02-20 | Disposition: A | Discharge status: Home / Self Care | Payer: Medicare Other | Attending: Emergency Medicine | Admitting: Emergency Medicine

## 2024-02-20 DIAGNOSIS — R112 Nausea with vomiting, unspecified: Secondary | ICD-10-CM

## 2024-02-20 LAB — URINALYSIS, ROUTINE W REFLEX MICROSCOPIC
Bacteria, UA: NONE SEEN
Bilirubin Urine: NEGATIVE
Glucose, UA: NEGATIVE mg/dL
Ketones, ur: NEGATIVE mg/dL
Nitrite: NEGATIVE
Protein, ur: NEGATIVE mg/dL
Specific Gravity, Urine: 1.009 (ref 1.005–1.030)
pH: 7 (ref 5.0–8.0)

## 2024-02-20 MED ORDER — ONDANSETRON 4 MG PO TBDP: 4.0000 mg | ORAL_TABLET | Freq: Three times a day (TID) | ORAL | 0 refills | Status: AC | PRN

## 2024-02-20 NOTE — ED Notes (Signed)
 Pt provided crackers and water for PO challenge

## 2024-02-22 NOTE — ED Provider Notes (Signed)
 [name] Provider Note   CSN: [id number] Arrival date & time: 02/19/24  2213     History  Chief Complaint  Patient presents with   Vomiting    Alejandra Scott is a 68 y.o. female.  68 yo F here with emesis. Has been throwing up quite a bit then started having some blood streakign in it as well. No significant pain. No fevers. No diarrhea or constipation. No known sick contacts. No suspicious food intake.         Home Medications Prior to Admission medications   Medication Sig Start Date End Date Taking? Authorizing Provider  ondansetron (ZOFRAN-ODT) 4 MG disintegrating tablet Take 1 tablet (4 mg total) by mouth every 8 (eight) hours as needed for vomiting. 02/20/24  Yes Armond Cuthrell, Barbara Cower, MD  acetaminophen (TYLENOL) 325 MG tablet Take 2 tablets (650 mg total) by mouth every 6 (six) hours as needed. 11/17/23   Sloan Leiter, DO  cyclobenzaprine (FLEXERIL) 10 MG tablet Take 1 tablet (10 mg total) by mouth 2 (two) times daily as needed for muscle spasms. 11/17/23   Sloan Leiter, DO  ibuprofen (ADVIL) 600 MG tablet Take 1 tablet (600 mg total) by mouth every 6 (six) hours as needed. 11/17/23   Tanda Rockers A, DO  lidocaine (LIDODERM) 5 % Place 1 patch onto the skin daily as needed. Remove & Discard patch within 12 hours or as directed by MD 11/17/23   Sloan Leiter, DO  meloxicam (MOBIC) 15 MG tablet Take 1 tablet (15 mg total) by mouth daily. Patient not taking: Reported on 02/01/2021 01/19/21   [name], DPM  Vitamin D, Ergocalciferol, (DRISDOL) 1.25 MG (50000 UNIT) CAPS capsule Take 50,000 Units by mouth once a week. Patient not taking: Reported on 02/01/2021 09/27/20   [provider]      Allergies    Bee venom and Penicillins    Review of Systems   Review of Systems  Physical Exam Updated Vital Signs BP 127/73   Pulse (!) 56   Temp 99.3 F (37.4 C) (Oral)   Resp 20   Ht 5\' 4"  (1.626 m)   Wt 63.5 kg   SpO2  95%   BMI 24.03 kg/m  Physical Exam Vitals and nursing note reviewed.  Constitutional:      Appearance: She is well-developed.  HENT:     Head: Normocephalic and atraumatic.  Cardiovascular:     Rate and Rhythm: Normal rate and regular rhythm.  Pulmonary:     Effort: No respiratory distress.     Breath sounds: No stridor.  Abdominal:     General: There is no distension.     Tenderness: There is no abdominal tenderness.  Musculoskeletal:     Cervical back: Normal range of motion.  Skin:    General: Skin is warm and dry.  Neurological:     General: No focal deficit present.     Mental Status: She is alert.     ED Results / Procedures / Treatments   Labs (all labs ordered are listed, but only abnormal results are displayed) Labs Reviewed  COMPREHENSIVE METABOLIC PANEL - Abnormal; Notable for the following components:      Result Value   Glucose, Bld 114 (*)    All other components within normal limits  URINALYSIS, ROUTINE W REFLEX MICROSCOPIC - Abnormal; Notable for the following components:   Hgb urine dipstick TRACE (*)    Leukocytes,Ua SMALL (*)  All other components within normal limits  RESP PANEL BY RT-PCR (RSV, FLU A&B, COVID)  RVPGX2  LIPASE, BLOOD  CBC    EKG None  Radiology No results found.  Procedures Procedures    Medications Ordered in ED Medications  ondansetron (ZOFRAN) injection 4 mg (4 mg Intravenous Given 02/19/24 2239)    ED Course/ Medical Decision Making/ A&P                                 Medical Decision Making Amount and/or Complexity of Data Reviewed Labs: ordered.  Risk Prescription drug management.   Workup reassuring. No focal ttp to sugges need for imaging or further workup. Suspect blood from either gastritis or mallory weiss, doubt ruptured ulcer, varices or boerhaves. Symptoms improved with meds. No e/o dehydration. No evidence of bacterial infection. Tolerating PO. Will d/c w/ pcp follow up.   Final Clinical  Impression(s) / ED Diagnoses Final diagnoses:  Nausea and vomiting, unspecified vomiting type    Rx / DC Orders ED Discharge Orders          Ordered    ondansetron (ZOFRAN-ODT) 4 MG disintegrating tablet  Every 8 hours PRN        02/20/24 0432              Sender Rueb, Barbara Cower, MD 02/22/24 [phone]
